# Patient Record
Sex: Female | Born: 1940 | Race: White | Hispanic: No | Smoking: Never smoker
Health system: Southern US, Community
[De-identification: ages and names within clinical notes are randomized; demographics above are authoritative.]

## PROBLEM LIST (undated history)

## (undated) DIAGNOSIS — Z8709 Personal history of other diseases of the respiratory system: Secondary | ICD-10-CM

## (undated) DIAGNOSIS — I10 Essential (primary) hypertension: Secondary | ICD-10-CM

## (undated) DIAGNOSIS — Z9289 Personal history of other medical treatment: Secondary | ICD-10-CM

## (undated) DIAGNOSIS — E785 Hyperlipidemia, unspecified: Secondary | ICD-10-CM

## (undated) DIAGNOSIS — M1990 Unspecified osteoarthritis, unspecified site: Secondary | ICD-10-CM

## (undated) DIAGNOSIS — Z9889 Other specified postprocedural states: Secondary | ICD-10-CM

## (undated) DIAGNOSIS — K219 Gastro-esophageal reflux disease without esophagitis: Secondary | ICD-10-CM

## (undated) DIAGNOSIS — C50919 Malignant neoplasm of unspecified site of unspecified female breast: Secondary | ICD-10-CM

## (undated) DIAGNOSIS — Z973 Presence of spectacles and contact lenses: Secondary | ICD-10-CM

## (undated) DIAGNOSIS — R32 Unspecified urinary incontinence: Secondary | ICD-10-CM

## (undated) DIAGNOSIS — K469 Unspecified abdominal hernia without obstruction or gangrene: Secondary | ICD-10-CM

## (undated) DIAGNOSIS — Z972 Presence of dental prosthetic device (complete) (partial): Secondary | ICD-10-CM

## (undated) DIAGNOSIS — K08109 Complete loss of teeth, unspecified cause, unspecified class: Secondary | ICD-10-CM

## (undated) DIAGNOSIS — Z8719 Personal history of other diseases of the digestive system: Secondary | ICD-10-CM

## (undated) HISTORY — DX: Unspecified abdominal hernia without obstruction or gangrene: K46.9

## (undated) HISTORY — PX: ABDOMINAL HYSTERECTOMY: SHX81

## (undated) HISTORY — PX: OTHER SURGICAL HISTORY: SHX169

## (undated) HISTORY — DX: Essential (primary) hypertension: I10

## (undated) HISTORY — DX: Gastro-esophageal reflux disease without esophagitis: K21.9

## (undated) HISTORY — PX: COLONOSCOPY: SHX174

## (undated) HISTORY — DX: Malignant neoplasm of unspecified site of unspecified female breast: C50.919

## (undated) HISTORY — DX: Hyperlipidemia, unspecified: E78.5

## (undated) HISTORY — PX: ESOPHAGOGASTRODUODENOSCOPY: SHX1529

## (undated) HISTORY — PX: CHOLECYSTECTOMY: SHX55

## (undated) HISTORY — PX: HAND SURGERY: SHX662

## (undated) HISTORY — DX: Unspecified osteoarthritis, unspecified site: M19.90

## (undated) HISTORY — DX: Personal history of other medical treatment: Z92.89

## (undated) HISTORY — DX: Other specified postprocedural states: Z98.890

## (undated) HISTORY — PX: SHOULDER SURGERY: SHX246

## (undated) HISTORY — DX: Presence of spectacles and contact lenses: Z97.3

---

## 1999-09-21 ENCOUNTER — Encounter: Admission: RE | Admit: 1999-09-21 | Discharge: 1999-09-21 | Payer: Self-pay | Admitting: Hematology and Oncology

## 1999-10-04 ENCOUNTER — Ambulatory Visit (HOSPITAL_COMMUNITY): Admission: RE | Admit: 1999-10-04 | Discharge: 1999-10-04 | Payer: Self-pay | Admitting: *Deleted

## 1999-10-18 ENCOUNTER — Encounter: Admission: RE | Admit: 1999-10-18 | Discharge: 1999-10-18 | Payer: Self-pay | Admitting: Internal Medicine

## 1999-12-07 ENCOUNTER — Encounter: Admission: RE | Admit: 1999-12-07 | Discharge: 1999-12-07 | Payer: Self-pay | Admitting: Internal Medicine

## 1999-12-07 ENCOUNTER — Ambulatory Visit (HOSPITAL_COMMUNITY): Admission: RE | Admit: 1999-12-07 | Discharge: 1999-12-07 | Payer: Self-pay | Admitting: Internal Medicine

## 1999-12-07 ENCOUNTER — Encounter: Payer: Self-pay | Admitting: Internal Medicine

## 1999-12-14 ENCOUNTER — Encounter: Admission: RE | Admit: 1999-12-14 | Discharge: 1999-12-14 | Payer: Self-pay | Admitting: Hematology and Oncology

## 1999-12-17 ENCOUNTER — Encounter: Admission: RE | Admit: 1999-12-17 | Discharge: 1999-12-17 | Payer: Self-pay | Admitting: Internal Medicine

## 1999-12-17 ENCOUNTER — Ambulatory Visit (HOSPITAL_COMMUNITY): Admission: RE | Admit: 1999-12-17 | Discharge: 1999-12-17 | Payer: Self-pay | Admitting: *Deleted

## 1999-12-21 ENCOUNTER — Other Ambulatory Visit: Admission: RE | Admit: 1999-12-21 | Discharge: 1999-12-21 | Payer: Self-pay | Admitting: Obstetrics & Gynecology

## 1999-12-21 ENCOUNTER — Encounter: Admission: RE | Admit: 1999-12-21 | Discharge: 1999-12-21 | Payer: Self-pay | Admitting: Obstetrics & Gynecology

## 1999-12-28 ENCOUNTER — Encounter: Admission: RE | Admit: 1999-12-28 | Discharge: 1999-12-28 | Payer: Self-pay | Admitting: Obstetrics & Gynecology

## 1999-12-30 ENCOUNTER — Ambulatory Visit (HOSPITAL_COMMUNITY): Admission: RE | Admit: 1999-12-30 | Discharge: 1999-12-30 | Payer: Self-pay | Admitting: Obstetrics & Gynecology

## 1999-12-30 ENCOUNTER — Encounter: Payer: Self-pay | Admitting: Obstetrics & Gynecology

## 2000-01-19 ENCOUNTER — Ambulatory Visit: Admission: RE | Admit: 2000-01-19 | Discharge: 2000-01-19 | Payer: Self-pay | Admitting: Gynecology

## 2000-01-25 ENCOUNTER — Encounter (INDEPENDENT_AMBULATORY_CARE_PROVIDER_SITE_OTHER): Payer: Self-pay

## 2000-01-25 ENCOUNTER — Inpatient Hospital Stay (HOSPITAL_COMMUNITY): Admission: RE | Admit: 2000-01-25 | Discharge: 2000-01-27 | Payer: Self-pay | Admitting: Gynecology

## 2000-02-01 ENCOUNTER — Ambulatory Visit: Admission: RE | Admit: 2000-02-01 | Discharge: 2000-02-01 | Payer: Self-pay | Admitting: Gynecology

## 2000-02-08 ENCOUNTER — Encounter: Admission: RE | Admit: 2000-02-08 | Discharge: 2000-02-08 | Payer: Self-pay | Admitting: Internal Medicine

## 2000-02-29 ENCOUNTER — Ambulatory Visit (HOSPITAL_COMMUNITY): Admission: RE | Admit: 2000-02-29 | Discharge: 2000-02-29 | Payer: Self-pay | Admitting: Gastroenterology

## 2000-03-07 ENCOUNTER — Encounter: Admission: RE | Admit: 2000-03-07 | Discharge: 2000-03-07 | Payer: Self-pay | Admitting: Obstetrics & Gynecology

## 2000-06-27 ENCOUNTER — Encounter: Admission: RE | Admit: 2000-06-27 | Discharge: 2000-06-27 | Payer: Self-pay | Admitting: Obstetrics

## 2000-08-08 ENCOUNTER — Encounter: Admission: RE | Admit: 2000-08-08 | Discharge: 2000-08-08 | Payer: Self-pay | Admitting: Obstetrics & Gynecology

## 2000-08-08 ENCOUNTER — Other Ambulatory Visit: Admission: RE | Admit: 2000-08-08 | Discharge: 2000-08-08 | Payer: Self-pay | Admitting: Obstetrics & Gynecology

## 2000-09-18 ENCOUNTER — Encounter: Admission: RE | Admit: 2000-09-18 | Discharge: 2000-09-18 | Payer: Self-pay | Admitting: Internal Medicine

## 2000-09-20 ENCOUNTER — Encounter: Admission: RE | Admit: 2000-09-20 | Discharge: 2000-09-20 | Payer: Self-pay | Admitting: Internal Medicine

## 2000-10-06 ENCOUNTER — Ambulatory Visit (HOSPITAL_COMMUNITY): Admission: RE | Admit: 2000-10-06 | Discharge: 2000-10-06 | Payer: Self-pay | Admitting: Internal Medicine

## 2000-10-06 ENCOUNTER — Encounter: Payer: Self-pay | Admitting: Internal Medicine

## 2000-10-10 ENCOUNTER — Encounter: Admission: RE | Admit: 2000-10-10 | Discharge: 2000-10-10 | Payer: Self-pay | Admitting: Obstetrics & Gynecology

## 2000-10-23 ENCOUNTER — Encounter: Admission: RE | Admit: 2000-10-23 | Discharge: 2000-10-23 | Payer: Self-pay | Admitting: Internal Medicine

## 2001-01-08 ENCOUNTER — Encounter: Admission: RE | Admit: 2001-01-08 | Discharge: 2001-01-08 | Payer: Self-pay | Admitting: Internal Medicine

## 2001-01-15 ENCOUNTER — Encounter: Admission: RE | Admit: 2001-01-15 | Discharge: 2001-01-15 | Payer: Self-pay | Admitting: Internal Medicine

## 2001-04-05 ENCOUNTER — Encounter: Admission: RE | Admit: 2001-04-05 | Discharge: 2001-04-05 | Payer: Self-pay

## 2001-04-06 ENCOUNTER — Encounter: Admission: RE | Admit: 2001-04-06 | Discharge: 2001-04-06 | Payer: Self-pay

## 2001-04-27 ENCOUNTER — Encounter: Admission: RE | Admit: 2001-04-27 | Discharge: 2001-04-27 | Payer: Self-pay | Admitting: Obstetrics & Gynecology

## 2001-05-14 ENCOUNTER — Encounter: Admission: RE | Admit: 2001-05-14 | Discharge: 2001-05-14 | Payer: Self-pay | Admitting: Internal Medicine

## 2001-06-15 ENCOUNTER — Encounter: Admission: RE | Admit: 2001-06-15 | Discharge: 2001-06-15 | Payer: Self-pay | Admitting: Internal Medicine

## 2001-09-04 ENCOUNTER — Encounter: Admission: RE | Admit: 2001-09-04 | Discharge: 2001-09-04 | Payer: Self-pay | Admitting: Internal Medicine

## 2001-10-08 ENCOUNTER — Ambulatory Visit (HOSPITAL_COMMUNITY): Admission: RE | Admit: 2001-10-08 | Discharge: 2001-10-08 | Payer: Self-pay | Admitting: *Deleted

## 2001-10-12 ENCOUNTER — Encounter: Admission: RE | Admit: 2001-10-12 | Discharge: 2001-10-12 | Payer: Self-pay | Admitting: Internal Medicine

## 2001-11-02 ENCOUNTER — Encounter: Admission: RE | Admit: 2001-11-02 | Discharge: 2001-11-02 | Payer: Self-pay | Admitting: Obstetrics & Gynecology

## 2001-12-07 ENCOUNTER — Encounter: Admission: RE | Admit: 2001-12-07 | Discharge: 2001-12-07 | Payer: Self-pay | Admitting: Obstetrics & Gynecology

## 2002-09-25 ENCOUNTER — Encounter: Admission: RE | Admit: 2002-09-25 | Discharge: 2002-09-25 | Payer: Self-pay

## 2002-09-25 ENCOUNTER — Ambulatory Visit (HOSPITAL_COMMUNITY): Admission: RE | Admit: 2002-09-25 | Discharge: 2002-09-25 | Payer: Self-pay | Admitting: Internal Medicine

## 2002-09-25 ENCOUNTER — Encounter: Payer: Self-pay | Admitting: Internal Medicine

## 2002-10-09 ENCOUNTER — Ambulatory Visit (HOSPITAL_COMMUNITY): Admission: RE | Admit: 2002-10-09 | Discharge: 2002-10-09 | Payer: Self-pay | Admitting: Internal Medicine

## 2002-10-09 ENCOUNTER — Encounter: Admission: RE | Admit: 2002-10-09 | Discharge: 2002-10-09 | Payer: Self-pay | Admitting: Internal Medicine

## 2002-10-23 ENCOUNTER — Encounter: Admission: RE | Admit: 2002-10-23 | Discharge: 2002-10-23 | Payer: Self-pay | Admitting: Internal Medicine

## 2003-01-10 ENCOUNTER — Encounter: Admission: RE | Admit: 2003-01-10 | Discharge: 2003-01-10 | Payer: Self-pay | Admitting: Internal Medicine

## 2003-05-25 ENCOUNTER — Encounter: Payer: Self-pay | Admitting: General Practice

## 2003-05-25 ENCOUNTER — Ambulatory Visit (HOSPITAL_COMMUNITY): Admission: RE | Admit: 2003-05-25 | Discharge: 2003-05-25 | Payer: Self-pay | Admitting: General Practice

## 2003-05-27 ENCOUNTER — Ambulatory Visit (HOSPITAL_COMMUNITY): Admission: RE | Admit: 2003-05-27 | Discharge: 2003-05-29 | Payer: Self-pay | Admitting: General Surgery

## 2003-05-27 ENCOUNTER — Encounter (INDEPENDENT_AMBULATORY_CARE_PROVIDER_SITE_OTHER): Payer: Self-pay | Admitting: *Deleted

## 2003-05-27 ENCOUNTER — Encounter: Payer: Self-pay | Admitting: General Surgery

## 2003-05-28 ENCOUNTER — Encounter: Payer: Self-pay | Admitting: Internal Medicine

## 2003-07-04 ENCOUNTER — Encounter: Admission: RE | Admit: 2003-07-04 | Discharge: 2003-07-04 | Payer: Self-pay | Admitting: Internal Medicine

## 2003-07-15 ENCOUNTER — Encounter: Admission: RE | Admit: 2003-07-15 | Discharge: 2003-07-15 | Payer: Self-pay | Admitting: Internal Medicine

## 2003-09-24 ENCOUNTER — Encounter: Admission: RE | Admit: 2003-09-24 | Discharge: 2003-09-24 | Payer: Self-pay | Admitting: Internal Medicine

## 2003-10-13 ENCOUNTER — Ambulatory Visit (HOSPITAL_COMMUNITY): Admission: RE | Admit: 2003-10-13 | Discharge: 2003-10-13 | Payer: Self-pay | Admitting: Internal Medicine

## 2003-12-20 DIAGNOSIS — Z9289 Personal history of other medical treatment: Secondary | ICD-10-CM

## 2003-12-20 HISTORY — DX: Personal history of other medical treatment: Z92.89

## 2004-02-19 ENCOUNTER — Encounter: Admission: RE | Admit: 2004-02-19 | Discharge: 2004-02-19 | Payer: Self-pay | Admitting: Internal Medicine

## 2004-03-11 ENCOUNTER — Encounter: Admission: RE | Admit: 2004-03-11 | Discharge: 2004-03-11 | Payer: Self-pay | Admitting: Internal Medicine

## 2005-03-09 ENCOUNTER — Ambulatory Visit: Payer: Self-pay | Admitting: Internal Medicine

## 2005-03-29 ENCOUNTER — Ambulatory Visit (HOSPITAL_COMMUNITY): Admission: RE | Admit: 2005-03-29 | Discharge: 2005-03-29 | Payer: Self-pay | Admitting: Internal Medicine

## 2005-04-11 ENCOUNTER — Ambulatory Visit: Payer: Self-pay | Admitting: Internal Medicine

## 2006-05-29 ENCOUNTER — Other Ambulatory Visit: Admission: RE | Admit: 2006-05-29 | Discharge: 2006-05-29 | Payer: Self-pay | Admitting: Family Medicine

## 2006-06-02 ENCOUNTER — Ambulatory Visit (HOSPITAL_COMMUNITY): Admission: RE | Admit: 2006-06-02 | Discharge: 2006-06-02 | Payer: Self-pay | Admitting: Family Medicine

## 2006-09-06 ENCOUNTER — Inpatient Hospital Stay (HOSPITAL_COMMUNITY): Admission: RE | Admit: 2006-09-06 | Discharge: 2006-09-10 | Payer: Self-pay | Admitting: Orthopedic Surgery

## 2007-06-01 ENCOUNTER — Other Ambulatory Visit: Admission: RE | Admit: 2007-06-01 | Discharge: 2007-06-01 | Payer: Self-pay | Admitting: Family Medicine

## 2007-06-08 ENCOUNTER — Ambulatory Visit (HOSPITAL_COMMUNITY): Admission: RE | Admit: 2007-06-08 | Discharge: 2007-06-08 | Payer: Self-pay | Admitting: Family Medicine

## 2008-06-06 ENCOUNTER — Other Ambulatory Visit: Admission: RE | Admit: 2008-06-06 | Discharge: 2008-06-06 | Payer: Self-pay | Admitting: Family Medicine

## 2008-06-13 ENCOUNTER — Ambulatory Visit (HOSPITAL_COMMUNITY): Admission: RE | Admit: 2008-06-13 | Discharge: 2008-06-13 | Payer: Self-pay | Admitting: Family Medicine

## 2009-06-19 ENCOUNTER — Ambulatory Visit (HOSPITAL_COMMUNITY): Admission: RE | Admit: 2009-06-19 | Discharge: 2009-06-19 | Payer: Self-pay | Admitting: Family Medicine

## 2009-10-21 ENCOUNTER — Other Ambulatory Visit: Admission: RE | Admit: 2009-10-21 | Discharge: 2009-10-21 | Payer: Self-pay | Admitting: Family Medicine

## 2010-06-18 DIAGNOSIS — Z9889 Other specified postprocedural states: Secondary | ICD-10-CM

## 2010-06-18 HISTORY — DX: Other specified postprocedural states: Z98.890

## 2010-06-22 ENCOUNTER — Ambulatory Visit (HOSPITAL_COMMUNITY): Admission: RE | Admit: 2010-06-22 | Discharge: 2010-06-22 | Payer: Self-pay | Admitting: Family Medicine

## 2010-11-03 ENCOUNTER — Other Ambulatory Visit: Admission: RE | Admit: 2010-11-03 | Discharge: 2010-11-03 | Payer: Self-pay | Admitting: Family Medicine

## 2011-05-06 NOTE — Op Note (Signed)
NAME:  Elizabeth, Lloyd                        ACCOUNT NO.:  [id number]   MEDICAL RECORD NO.:  [id number]                   PATIENT TYPE:  OIB   LOCATION:  2550                                 FACILITY:  [facility]   PHYSICIAN:  Sharlet Salina T. Hoxworth, M.D.          DATE OF BIRTH:  [birth year]   DATE OF PROCEDURE:  05/27/2003  DATE OF DISCHARGE:                                 OPERATIVE REPORT   PREOPERATIVE DIAGNOSIS:  Acute cholecystitis and cholelithiasis.   POSTOPERATIVE DIAGNOSIS:  Acute cholecystitis and cholelithiasis,  choledocholithiasis.   OPERATION PERFORMED:  Laparoscopic cholecystectomy with intraoperative  cholangiogram.   SURGEON:  Sharlet Salina T. Hoxworth, M.D.   ASSISTANT:  Donnie Coffin. Samuella Cota, M.D.   ANESTHESIA:  General.   INDICATIONS FOR PROCEDURE:  The patient is a 70 year old white female in her  usual state of good health until about 48 hours ago when she developed the  sudden onset of sharp, right upper quadrant and flank pain and nausea and  vomiting.  She has had a work-up yesterday including gallbladder ultrasound  showing multiple gallstone and a thickened gallbladder wall.  She has had  marked tenderness in the right upper quadrant.  Liver function tests are  moderately elevated with a bilirubin of 2 and alkaline phosphatase of 178.  Urgent laparoscopic cholecystectomy with cholangiogram has been recommended  and accepted.  The nature of the procedure, its indications and risks of  bleeding, infection, bile leak and bile duct injury were discussed and  understood.  She is now brought to the operating room for this procedure.   DESCRIPTION OF PROCEDURE:  The patient was brought to the operating room and  placed in supine position on the operating table and general endotracheal  anesthesia was induced.  She had received preoperative broad spectrum  antibiotics.  The abdomen was sterilely prepped and draped.  Local  anesthesia was used to infiltrate the trocar  sites prior to the incisions.  A 1 cm incision was made at the umbilicus and dissection carried down to the  midline fascia which was sharply incised for 1 cm with the peritoneum  entered under direct vision.  Through a mattress suture of 0 Vicryl, the  Hasson trocar was placed and pneumoperitoneum established.  Under direct  vision, a 10 mm trocar was placed in the subxiphoid area and two 5 mm  trocars on the right subcostal margin.  The gallbladder was severely acutely  inflamed with early gangrenous changes.  Omental adhesions were carefully  taken down bluntly and the gallbladder was decompressed with a needle  aspirator.  The fundus was grasped and elevated up over the liver and the  infundibulum retracted inferolaterally.  Fibrofatty tissue was stripped down  off the neck of the gallbladder toward the porta hepatis.  Calot's triangle  was dissected, mostly with blunt dissection with very edematous tissue.  The  cystic artery was identified coursing up on the gallbladder wall and was  divided between two proximal and distal clips.  The distal gallbladder and  gallbladder cystic duct junction was dissected  and the cystic duct  identified, dissected at its junction with the gallbladder 360 degrees.  The  cystic duct was then clipped at the gallbladder junction and operative  cholangiogram obtained through the cystic duct.  This showed good filling of  a nondilated common bile duct, common hepatic duct and intrahepatic ducts.  There was, however, a meniscus filling defect at the distal common bile duct  with minimal if any contrast getting through to the duodenum.  Following  this, the cholangiocath was removed.  I did pass a Fogarty catheter through  the cystic duct.  I placed one into the common bile duct but was unable to  get past the ampulla a couple of attempts and could not retrieve the stone.  Due to the severe inflammation and somewhat friable nature of the cystic  duct, I  elected not to attempt a laparoscopic common duct exploration but to  complete the cholecystectomy and plan postoperative endoscopic retrograde  cholangiopancreatography.  At this point the cystic duct was triply clipped  proximally and divided.  The gallbladder was then dissected free from its  bed using hook cautery.  There was marked edema and inflammation.  The  gallbladder was placed in the EndoCatch bag and removed through the  umbilicus.  The gallbladder bed was cauterized and the right upper quadrant  thoroughly irrigated and hemostasis assured.  Closed suction drain was left  using Morrison's pouch and brought out through one of the lateral trocar  sites.  The trocars were removed under direct vision and all CO2 evacuated  from the peritoneal cavity.  The mattress suture was secured to the  umbilicus.  Skin incisions were closed with interrupted subcuticular 4-0  Monocryl and Steri-Strips.  Sponge, needle and instrument counts were  correct.  Dry sterile dressings were applied.  The patient was taken to the  recovery room in good condition.                                                Lorne Skeens. Hoxworth, M.D.    Tory Emerald  D:  05/27/2003  T:  05/27/2003  Job:  [id number]

## 2011-05-06 NOTE — Op Note (Signed)
NAME:  Elizabeth Lloyd, Elizabeth Lloyd NO.:  [id number]   MEDICAL RECORD NO.:  [id number]                   PATIENT TYPE:  OIB   LOCATION:  5730                                 FACILITY:  [facility]   PHYSICIAN:  Wilhemina Bonito. Marina Goodell, M.D. [facility]             DATE OF BIRTH:  [birth year]   DATE OF PROCEDURE:  05/28/2003  DATE OF DISCHARGE:                                 OPERATIVE REPORT   PROCEDURE:  Endoscopic retrograde cholangiopancreatography with biliary  sphincterotomy.   INDICATIONS FOR PROCEDURE:  Apparent retained common bile duct stone post  laparoscopic cholecystectomy.   HISTORY:  This is a pleasant 70 year old female who was admitted to the  hospital May 27, 2003 with acute cholecystitis. She underwent laparoscopic  cholecystectomy with Dr. Johna Sheriff. She is indeed found to have acute  cholecystitis with cholelithiasis. Intraoperative cholangiogram revealed an  apparent distal filling defect with obstruction worrisome for stone. A drain  was left in the gallbladder bed. The patient has recovered well since  surgery. She does have mild abnormalities in the liver function tests. She  is for ERCP with possible sphincterotomy and stone extraction. The nature of  the procedure as well as its risks, benefits, and alternatives were  discussed in detail. She understood and agreed to proceed.   PHYSICAL EXAMINATION:  GENERAL:  Well appearing female in no acute distress.  She is alert and oriented.  VITAL SIGNS:  Stable.  LUNGS:  Clear.  HEART:  Regular.  ABDOMEN:  Soft with slight tenderness near the surgical incision sites.   DESCRIPTION OF PROCEDURE:  After informed consent was obtained, the patient  was sedated with 80 mg of Demerol and 8 mg of Versed IV.  Preoperative  antibiotics were continued. Glucagon 1.0 mg IV was given as a duodenal  relaxant. The Olympus side viewing endoscope was passed blindly into the  esophagus. The stomach revealed several traumatic areas on  the mucosa  consistent with prior NG tube placement. Otherwise normal. The duodenal bulb  was normal. The post bulbar duodenum was normal. The major ampulla was  normal. The minor ampulla was not sought.   X-RAY FINDINGS:  1. Scout radiograph of the abdomen with the endoscope in position revealed     cholecystectomy clips as well as gallbladder bed drain. No other     abnormalities.  2. Initial injection of contrast via the major papilla yielded a normal     pancreatogram. Access to the common bile duct was subsequently obtained     using a hydrophilic guidewire. Complete filling of the biliary tree     revealed no abnormalities of the common bile duct, common hepatic duct,     right or left hepatic ducts or intrahepatic ducts. The cystic duct     remnant was normal. No obvious stones. Phase was slightly delayed.   THERAPY:  Biliary sphincterotomy was made with hydrophilic guidewire in the  proximal biliary tree.  Cutting was via the ERBE system with cutting in the  12 o'clock orientation. The sphincterotomy size was deemed moderate. After  completing sphincterotomy, the cutting catheter was exchanged for an 8 mm  balloon. This was pulled through the biliary system several times with no  extraction of stones or debris. Occlusion cholangiogram was then performed.  System drained quite readily.   IMPRESSION:  Normal cholangiogram post laparoscopic cholecystectomy with no  evidence of stone. Status post biliary sphincterotomy with negative balloon  pull-throughs.   RECOMMENDATIONS:  1. Continue antibiotics.  2. Postoperative care per Dr. Johna Sheriff.                                               Wilhemina Bonito. Marina Goodell, M.D. [facility]    JNP/MEDQ  D:  05/28/2003  T:  05/29/2003  Job:  [id number]   cc:   Lorne Skeens. Hoxworth, M.D.  [address]  [place]  [place] [address]  Fax: [phone]   Anselmo Rod, M.D.  [address]  [address]  [place]  [place] [address]  Fax:  [phone]

## 2011-05-06 NOTE — Discharge Summary (Signed)
NAME:  Elizabeth Lloyd, FIFER NO.:  [id number]   MEDICAL RECORD NO.:  [id number]          PATIENT TYPE:  INP   LOCATION:  5008                         FACILITY:  [facility]   PHYSICIAN:  Nadara Mustard, MD     DATE OF BIRTH:  [birth year]   DATE OF ADMISSION:  09/06/2006  DATE OF DISCHARGE:  09/10/2006                                 DISCHARGE SUMMARY   DIAGNOSIS:  Tricompartmental osteoarthritis, left knee.   PROCEDURE:  Left total knee arthroplasty.   Discharged to home in stable condition with home health physical therapy.   PLAN:  To follow-up in the office in 2 weeks.   HISTORY OF PRESENT ILLNESS:  The patient is a 70 year old woman with  tricompartmental osteoarthritis of her left knee.  She has failed  conservative care including arthroscopy, anti-inflammatories, and steroid  injections without relief and presents at this time for total knee  arthroplasty.   The patient's hospital course was essentially unremarkable.  She underwent a  left total knee arthroplasty on September 06, 2006, with DePuy components.  She received Kefzol for infection prophylaxis.  Tourniquet time was 33  minutes at 300-mmHg.  She was started on Coumadin for DVT prophylaxis and  continued with 24 hours of antibiotics for IV infection prophylaxis.  Radiographs showed a stable alignment.  She was started on physical therapy  with progressive ambulation.  She progressed well with therapy and was  discharged to home in stable condition on September 10, 2006, with [facility] with followup in the office in 2 weeks.      Nadara Mustard, MD  Electronically Signed     MVD/MEDQ  D:  09/26/2006  T:  09/27/2006  Job:  [id number]

## 2011-05-06 NOTE — Op Note (Signed)
NAME:  Elizabeth Lloyd, Elizabeth Lloyd NO.:  [id number]   MEDICAL RECORD NO.:  [id number]          PATIENT TYPE:  INP   LOCATION:  5008                         FACILITY:  [facility]   PHYSICIAN:  Nadara Mustard, MD     DATE OF BIRTH:  [birth year]   DATE OF PROCEDURE:  09/06/2006  DATE OF DISCHARGE:                                 OPERATIVE REPORT   PREOPERATIVE DIAGNOSIS:  Osteoarthritis, left knee.   POSTOPERATIVE DIAGNOSIS:  Osteoarthritis, left knee.   PROCEDURE:  Left total knee arthroplasty with DePuy mobile bearing  components #3 tibia, #3 femur, 10-mm posterior stabilized poly tray with a  35-mm patella.   SURGEON:  Nadara Mustard, MD   ANESTHESIA:  General.   ESTIMATED BLOOD LOSS:  Minimal.   ANTIBIOTICS:  1 gram of Kefzol.   DRAINS:  None.   COMPLICATIONS:  None.   TOURNIQUET TIME:  33 minutes at 300 mmHg the thigh.   DISPOSITION:  To PACU in stable condition.   INDICATIONS FOR PROCEDURE:  The patient is a 70 year old woman with  osteoarthritis of her left knee.  She has failed conservative care and  wishes to proceed with total knee arthroplasty at this time.  Risks and  benefits were discussed including infection, neurovascular injury,  persistent pain, DVT, pulmonary embolus, need for additional surgery.  The  patient states she understands and wished proceed at this time.   DESCRIPTION OF PROCEDURE:  The patient was brought to OR room 4 and  underwent a general anesthetic.  After adequate level of anesthesia obtained  the patient's left lower extremity was prepped using DuraPrep, draped in a  sterile field.  Collier Flowers was used to cover all exposed skin.  The knee was  flexed and the midline incision was made.  This was carried down with a  medial parapatellar retinacular incision.  The drill was used for the  femoral canal and the IM guide was used for the femur set for 5 degrees  valgus at 10 mm the distal 10 mm cut was made.  This was sized for a size 3  and  the chamfer cuts were made for size 3.  Attention was then focused on  the tibia.  External alignment guide was used and this was used to take 8 mm  off the most prominent lateral tibial plateau.  This was set for neutral  varus and valgus, neutral posterior slope.  The tibial cut was made.  This  was sized for a size 3 and the tibial trial and key punch was placed for  size 3.  Attention was then focused on the femur.  The box cut was made for  the femur.  The femoral and tibial trials were placed and this had the best  range of motion with the size 10 tray, with stable varus and valgus stress.  The resurfacing cut was made for the patella and 10 mm was taken off the  patella.  The keel punches were made for the patella and the punches were  made for the femur as well.  The instruments were removed.  The  knee was  irrigated with pulse lavage.  The posterior capsule and medial and anterior  aspect of the knee were injected with a total of 60 mL of quarter percent  Marcaine plain.  The tibial femoral and patellar components were cemented in  place with the patella component placed.  The knee was irrigated with pulse  lavage prior to and after the components were placed.  The knee was left in  extension until the cement had hardened.  The knee was then placed through  full range of motion and the patella tracked midline.  The tourniquet was  deflated after 33 minutes.  The tourniquet was inflated prior to the bone  cuts.  The retinacular incision was closed using #1 Vicryl.  Subcu was  closed using 2-0 Vicryl, the skin was closed using Proximate staples.  The  wound was covered with Adaptic orthopedic sponges, Webril and a Coban  dressing.  The patient was placed in a ice pack, extubated and taken to PACU  in stable condition.      Nadara Mustard, MD  Electronically Signed     MVD/MEDQ  D:  09/06/2006  T:  09/07/2006  Job:  [id number]

## 2011-05-06 NOTE — Procedures (Signed)
[place]  Patient:    Elizabeth Lloyd, Elizabeth Lloyd                MRN: [id number] Proc. Date: 02/29/00 Adm. Date:  [id number] Attending:  Charna Elizabeth CC:         Myles Rosenthal, M.D.                           Procedure Report  DATE OF BIRTH:  [birth year]  REFERRING PHYSICIAN:  Myles Rosenthal, M.D.  PROCEDURE PERFORMED:  Esophagogastroduodenoscopy with biopsies.  ENDOSCOPIST:  Anselmo Rod, M.D.  INSTRUMENT USED:  Olympus video panendoscope.  INDICATIONS FOR PROCEDURE:  The patient is a 70 year old white female with epigastric pain, blood in stools, rule out peptic ulcer disease, esophagitis, gastritis, etc.  PREPROCEDURE PREPARATION:  Informed consent was procured from the patient.  The  patient was fasted for eight hours prior to the procedure.  PREPROCEDURE PHYSICAL:  The patient had stable vital signs.  Neck supple. Chest clear to auscultation.  S1, S2 regular.  Abdomen soft with normal abdominal bowel sounds.  Epigastric tenderness on palpation.  There was no rebound or rigidity,  minimal guarding present.  DESCRIPTION OF PROCEDURE:  The patient was placed in left lateral decubitus position and sedated with 40 mg of Demerol and 5 mg of Versed intravenously. Once the patient was adequately sedated and maintained on low-flow oxygen and continuous cardiac monitoring, the Olympus video panendoscope was advanced through the mouthpiece, over the tongue, into the esophagus under direct vision.  The proximal esophagus appeared normal but there was evidence of erosive esophagitis in the distal esophagus around the GE junction.  The scope was then advanced into the stomach and a small hiatal hernia was seen on high retroflexion.  There was antral gastritis with no frank ulcers seen.  There was mild duodenitis in the duodenal  bulb.  The patient tolerated the procedure well without complications.  IMPRESSION: 1. Distal erosive  esophagitis around the gastroesophageal junction. 2. Small hiatal hernia seen on retroflexion in the high cardia. 3. Antral gastritis with no ulcer seen. 4. Mild duodenitis in the duodenal bulb. 5. Antral biopsies done for Helicobacter pylori by CLO test.  RECOMMENDATION: 1. Patient has been prescribed Protonix 40 mg 1 p.o. q.d. #30 pills with    three refills has been called in to her local pharmacy. 2. She has been advised to try Carafate slurry 1 gm q.i.d. for the next 15    days.  This has also been called in to her local pharmacy. 3. She has strongly been advised to refrain from the use of all nonsteroidals. 4. Proceed with colonoscopy at this time. 5. Follow up in the office in the next 7 to 10 days. DD:  02/29/00 TD:  03/01/00 Job: [id number]

## 2011-05-06 NOTE — Procedures (Signed)
[place]  Patient:    Elizabeth Lloyd, Elizabeth Lloyd                MRN: [id number] Proc. Date: 02/29/00 Adm. Date:  [id number] Attending:  Charna Elizabeth CC:         Myles Rosenthal, M.D.                           Procedure Report  DATE OF BIRTH:  November 30, 1941  REFERRING PHYSICIAN:  Myles Rosenthal, M.D.  PROCEDURE PERFORMED:  Colonoscopy.  ENDOSCOPIST:  Anselmo Rod, M.D.  INSTRUMENT USED:  Olympus video colonoscope.  INDICATIONS FOR PROCEDURE:  Rectal bleeding and family history of colon cancer n 70 year old white female, rule out masses, polyps, erosions, ulcerations, etc.  PREPROCEDURE PREPARATION:  Informed consent was procured from the patient.  The  patient was fasted for eight hours prior to the procedure, and prepped with a bottle of magnesium citrate and a gallon of NuLytely the night prior to the procedure.  PREPROCEDURE PHYSICAL:  VITAL SIGNS:  Stable.  NECK:  Supple.  CHEST:  Clear to auscultation, S1 and S2 regular.  ABDOMEN:  Soft with normal abdominal bowel sounds.  Epigastric tenderness on palpation with guarding noted _______.  DESCRIPTION OF PROCEDURE:  The patient was placed in the left lateral decubitus  position.  No additional sedation was used for the colonoscopy.  Once the patient was adequately positioned, the Olympus video colonoscope was advanced from the rectum to the cecum without difficulty.  Except for small internal and external  hemorrhoids, no other abnormalities were seen.  No masses, polyps, erosions, ulcerations, or diverticular disease was present.  The patient tolerated the procedure well without complication.  IMPRESSION:  Normal colonoscopy except for small nonbleeding internal and external hemorrhoids.  RECOMMENDATIONS: 1. The patient has been advised to increase fluid and fiber in her diet. 2. Repeat colorectal cancer screening is recommended in the next five years    considering her  family history of colon cancer. 3. Outpatient follow up is advised in the next 7-10 days. DD:  02/29/00 TD:  03/01/00 Job: [id number]

## 2011-06-14 ENCOUNTER — Other Ambulatory Visit (HOSPITAL_COMMUNITY): Payer: Self-pay | Admitting: Family Medicine

## 2011-06-14 DIAGNOSIS — Z1231 Encounter for screening mammogram for malignant neoplasm of breast: Secondary | ICD-10-CM

## 2011-06-24 ENCOUNTER — Ambulatory Visit (HOSPITAL_COMMUNITY)
Admission: RE | Admit: 2011-06-24 | Discharge: 2011-06-24 | Disposition: A | Discharge status: Home / Self Care | Payer: Medicare Other | Source: Ambulatory Visit | Attending: Family Medicine | Admitting: Family Medicine

## 2011-06-24 DIAGNOSIS — Z1231 Encounter for screening mammogram for malignant neoplasm of breast: Secondary | ICD-10-CM | POA: Insufficient documentation

## 2011-06-28 ENCOUNTER — Other Ambulatory Visit: Payer: Self-pay | Admitting: Family Medicine

## 2011-06-28 DIAGNOSIS — R928 Other abnormal and inconclusive findings on diagnostic imaging of breast: Secondary | ICD-10-CM

## 2011-07-01 ENCOUNTER — Ambulatory Visit
Admission: RE | Admit: 2011-07-01 | Discharge: 2011-07-01 | Disposition: A | Discharge status: Home / Self Care | Payer: Medicare Other | Source: Ambulatory Visit | Attending: Family Medicine | Admitting: Family Medicine

## 2011-07-01 DIAGNOSIS — R928 Other abnormal and inconclusive findings on diagnostic imaging of breast: Secondary | ICD-10-CM

## 2011-12-06 ENCOUNTER — Other Ambulatory Visit: Payer: Self-pay | Admitting: Family Medicine

## 2011-12-06 DIAGNOSIS — N63 Unspecified lump in unspecified breast: Secondary | ICD-10-CM

## 2011-12-20 HISTORY — PX: BREAST LUMPECTOMY: SHX2

## 2012-01-20 ENCOUNTER — Other Ambulatory Visit: Payer: Self-pay | Admitting: Family Medicine

## 2012-01-20 ENCOUNTER — Ambulatory Visit
Admission: RE | Admit: 2012-01-20 | Discharge: 2012-01-20 | Disposition: A | Discharge status: Home / Self Care | Payer: Medicare Other | Source: Ambulatory Visit | Attending: Family Medicine | Admitting: Family Medicine

## 2012-01-20 DIAGNOSIS — N63 Unspecified lump in unspecified breast: Secondary | ICD-10-CM

## 2012-03-27 ENCOUNTER — Ambulatory Visit: Payer: Medicare Other | Admitting: Physical Therapy

## 2012-03-27 ENCOUNTER — Ambulatory Visit: Payer: Medicare Other | Attending: Orthopedic Surgery | Admitting: Physical Therapy

## 2012-03-27 DIAGNOSIS — M25619 Stiffness of unspecified shoulder, not elsewhere classified: Secondary | ICD-10-CM | POA: Insufficient documentation

## 2012-03-27 DIAGNOSIS — IMO0001 Reserved for inherently not codable concepts without codable children: Secondary | ICD-10-CM | POA: Insufficient documentation

## 2012-03-27 DIAGNOSIS — M25519 Pain in unspecified shoulder: Secondary | ICD-10-CM | POA: Insufficient documentation

## 2012-03-27 DIAGNOSIS — M6281 Muscle weakness (generalized): Secondary | ICD-10-CM | POA: Insufficient documentation

## 2012-03-29 ENCOUNTER — Ambulatory Visit: Payer: Medicare Other | Admitting: Physical Therapy

## 2012-04-03 ENCOUNTER — Ambulatory Visit: Payer: Medicare Other | Admitting: Physical Therapy

## 2012-04-05 ENCOUNTER — Ambulatory Visit: Payer: Medicare Other | Admitting: Physical Therapy

## 2012-04-10 ENCOUNTER — Ambulatory Visit: Payer: Medicare Other | Admitting: Physical Therapy

## 2012-04-12 ENCOUNTER — Ambulatory Visit: Payer: Medicare Other | Admitting: Physical Therapy

## 2012-04-17 ENCOUNTER — Ambulatory Visit: Payer: Medicare Other | Admitting: Physical Therapy

## 2012-04-19 ENCOUNTER — Ambulatory Visit: Payer: Medicare Other | Attending: Orthopedic Surgery | Admitting: Physical Therapy

## 2012-04-19 DIAGNOSIS — M25619 Stiffness of unspecified shoulder, not elsewhere classified: Secondary | ICD-10-CM | POA: Insufficient documentation

## 2012-04-19 DIAGNOSIS — IMO0001 Reserved for inherently not codable concepts without codable children: Secondary | ICD-10-CM | POA: Insufficient documentation

## 2012-04-19 DIAGNOSIS — M25519 Pain in unspecified shoulder: Secondary | ICD-10-CM | POA: Insufficient documentation

## 2012-04-19 DIAGNOSIS — M6281 Muscle weakness (generalized): Secondary | ICD-10-CM | POA: Insufficient documentation

## 2012-04-24 ENCOUNTER — Ambulatory Visit: Payer: Medicare Other | Admitting: Physical Therapy

## 2012-04-26 ENCOUNTER — Ambulatory Visit: Payer: Medicare Other | Admitting: Physical Therapy

## 2012-05-01 ENCOUNTER — Other Ambulatory Visit: Payer: Self-pay | Admitting: Family Medicine

## 2012-05-01 ENCOUNTER — Ambulatory Visit: Payer: Medicare Other | Admitting: Physical Therapy

## 2012-05-01 DIAGNOSIS — R921 Mammographic calcification found on diagnostic imaging of breast: Secondary | ICD-10-CM

## 2012-05-03 ENCOUNTER — Ambulatory Visit: Payer: Medicare Other | Admitting: Physical Therapy

## 2012-05-08 ENCOUNTER — Ambulatory Visit: Payer: Medicare Other | Admitting: Physical Therapy

## 2012-05-10 ENCOUNTER — Ambulatory Visit: Payer: Medicare Other | Admitting: Physical Therapy

## 2012-05-15 ENCOUNTER — Ambulatory Visit: Payer: Medicare Other | Admitting: Physical Therapy

## 2012-05-17 ENCOUNTER — Ambulatory Visit: Payer: Medicare Other | Admitting: Physical Therapy

## 2012-05-22 ENCOUNTER — Ambulatory Visit: Payer: Medicare Other | Attending: Orthopedic Surgery | Admitting: Physical Therapy

## 2012-05-22 DIAGNOSIS — M25619 Stiffness of unspecified shoulder, not elsewhere classified: Secondary | ICD-10-CM | POA: Insufficient documentation

## 2012-05-22 DIAGNOSIS — M25519 Pain in unspecified shoulder: Secondary | ICD-10-CM | POA: Insufficient documentation

## 2012-05-22 DIAGNOSIS — IMO0001 Reserved for inherently not codable concepts without codable children: Secondary | ICD-10-CM | POA: Insufficient documentation

## 2012-05-22 DIAGNOSIS — M6281 Muscle weakness (generalized): Secondary | ICD-10-CM | POA: Insufficient documentation

## 2012-05-25 ENCOUNTER — Ambulatory Visit: Payer: Medicare Other | Admitting: Physical Therapy

## 2012-05-28 ENCOUNTER — Ambulatory Visit: Payer: Medicare Other | Admitting: Physical Therapy

## 2012-05-30 ENCOUNTER — Ambulatory Visit: Payer: Medicare Other | Admitting: Physical Therapy

## 2012-06-04 ENCOUNTER — Encounter: Payer: Medicare Other | Admitting: Physical Therapy

## 2012-06-05 ENCOUNTER — Ambulatory Visit: Payer: Medicare Other | Admitting: Physical Therapy

## 2012-06-07 ENCOUNTER — Ambulatory Visit: Payer: Medicare Other | Admitting: Physical Therapy

## 2012-06-11 ENCOUNTER — Ambulatory Visit: Payer: Medicare Other | Admitting: Physical Therapy

## 2012-06-14 ENCOUNTER — Ambulatory Visit: Payer: Medicare Other | Admitting: Physical Therapy

## 2012-06-27 ENCOUNTER — Other Ambulatory Visit: Payer: Self-pay | Admitting: Family Medicine

## 2012-06-27 ENCOUNTER — Ambulatory Visit
Admission: RE | Admit: 2012-06-27 | Discharge: 2012-06-27 | Disposition: A | Discharge status: Home / Self Care | Payer: Medicare Other | Source: Ambulatory Visit | Attending: Family Medicine | Admitting: Family Medicine

## 2012-06-27 DIAGNOSIS — R921 Mammographic calcification found on diagnostic imaging of breast: Secondary | ICD-10-CM

## 2012-06-29 ENCOUNTER — Other Ambulatory Visit: Payer: Self-pay | Admitting: Family Medicine

## 2012-06-29 ENCOUNTER — Ambulatory Visit
Admission: RE | Admit: 2012-06-29 | Discharge: 2012-06-29 | Disposition: A | Discharge status: Home / Self Care | Payer: Medicare Other | Source: Ambulatory Visit | Attending: Family Medicine | Admitting: Family Medicine

## 2012-06-29 DIAGNOSIS — R921 Mammographic calcification found on diagnostic imaging of breast: Secondary | ICD-10-CM

## 2012-07-02 ENCOUNTER — Other Ambulatory Visit: Payer: Self-pay | Admitting: Family Medicine

## 2012-07-02 ENCOUNTER — Ambulatory Visit
Admission: RE | Admit: 2012-07-02 | Discharge: 2012-07-02 | Disposition: A | Discharge status: Home / Self Care | Payer: Medicare Other | Source: Ambulatory Visit | Attending: Family Medicine | Admitting: Family Medicine

## 2012-07-02 DIAGNOSIS — C50919 Malignant neoplasm of unspecified site of unspecified female breast: Secondary | ICD-10-CM

## 2012-07-02 DIAGNOSIS — R921 Mammographic calcification found on diagnostic imaging of breast: Secondary | ICD-10-CM

## 2012-07-02 DIAGNOSIS — D0510 Intraductal carcinoma in situ of unspecified breast: Secondary | ICD-10-CM

## 2012-07-05 ENCOUNTER — Telehealth: Payer: Self-pay | Admitting: *Deleted

## 2012-07-05 ENCOUNTER — Other Ambulatory Visit: Payer: Self-pay | Admitting: *Deleted

## 2012-07-05 DIAGNOSIS — C50412 Malignant neoplasm of upper-outer quadrant of left female breast: Secondary | ICD-10-CM | POA: Insufficient documentation

## 2012-07-05 DIAGNOSIS — C50419 Malignant neoplasm of upper-outer quadrant of unspecified female breast: Secondary | ICD-10-CM

## 2012-07-05 NOTE — Telephone Encounter (Signed)
Confirmed [facility] for 07/11/12 at 1230 .  Instructions and contact information given.

## 2012-07-06 ENCOUNTER — Ambulatory Visit
Admission: RE | Admit: 2012-07-06 | Discharge: 2012-07-06 | Disposition: A | Discharge status: Home / Self Care | Payer: Medicare Other | Source: Ambulatory Visit | Attending: Family Medicine | Admitting: Family Medicine

## 2012-07-06 ENCOUNTER — Other Ambulatory Visit: Payer: Self-pay | Admitting: Family Medicine

## 2012-07-06 DIAGNOSIS — D0510 Intraductal carcinoma in situ of unspecified breast: Secondary | ICD-10-CM

## 2012-07-06 DIAGNOSIS — C50919 Malignant neoplasm of unspecified site of unspecified female breast: Secondary | ICD-10-CM

## 2012-07-06 MED ORDER — GADOBENATE DIMEGLUMINE 529 MG/ML IV SOLN
20.0000 mL | Freq: Once | INTRAVENOUS | Status: AC | PRN
  Administered 2012-07-06: 20 mL via INTRAVENOUS

## 2012-07-09 ENCOUNTER — Other Ambulatory Visit: Payer: Medicare Other

## 2012-07-11 ENCOUNTER — Encounter: Payer: Self-pay | Admitting: Radiation Oncology

## 2012-07-11 ENCOUNTER — Encounter (INDEPENDENT_AMBULATORY_CARE_PROVIDER_SITE_OTHER): Payer: Self-pay | Admitting: General Surgery

## 2012-07-11 ENCOUNTER — Ambulatory Visit (HOSPITAL_BASED_OUTPATIENT_CLINIC_OR_DEPARTMENT_OTHER): Payer: Medicare Other | Admitting: Oncology

## 2012-07-11 ENCOUNTER — Ambulatory Visit (HOSPITAL_BASED_OUTPATIENT_CLINIC_OR_DEPARTMENT_OTHER): Payer: Medicare Other | Admitting: General Surgery

## 2012-07-11 ENCOUNTER — Ambulatory Visit (INDEPENDENT_AMBULATORY_CARE_PROVIDER_SITE_OTHER): Payer: Medicare Other | Admitting: General Surgery

## 2012-07-11 ENCOUNTER — Other Ambulatory Visit (HOSPITAL_BASED_OUTPATIENT_CLINIC_OR_DEPARTMENT_OTHER): Payer: Medicare Other | Admitting: Lab

## 2012-07-11 ENCOUNTER — Ambulatory Visit
Admission: RE | Admit: 2012-07-11 | Discharge: 2012-07-11 | Disposition: A | Discharge status: Home / Self Care | Payer: Medicare Other | Source: Ambulatory Visit | Attending: Radiation Oncology | Admitting: Radiation Oncology

## 2012-07-11 ENCOUNTER — Ambulatory Visit: Payer: Medicare Other | Attending: General Surgery | Admitting: Physical Therapy

## 2012-07-11 ENCOUNTER — Encounter: Payer: Self-pay | Admitting: *Deleted

## 2012-07-11 ENCOUNTER — Ambulatory Visit: Payer: Medicare Other

## 2012-07-11 VITALS — BP 102/65 | HR 73 | Temp 98.3°F | Ht 62.5 in | Wt 227.1 lb

## 2012-07-11 DIAGNOSIS — C50419 Malignant neoplasm of upper-outer quadrant of unspecified female breast: Secondary | ICD-10-CM

## 2012-07-11 DIAGNOSIS — M6281 Muscle weakness (generalized): Secondary | ICD-10-CM | POA: Insufficient documentation

## 2012-07-11 DIAGNOSIS — M25619 Stiffness of unspecified shoulder, not elsewhere classified: Secondary | ICD-10-CM | POA: Insufficient documentation

## 2012-07-11 DIAGNOSIS — IMO0001 Reserved for inherently not codable concepts without codable children: Secondary | ICD-10-CM | POA: Insufficient documentation

## 2012-07-11 DIAGNOSIS — M25519 Pain in unspecified shoulder: Secondary | ICD-10-CM | POA: Insufficient documentation

## 2012-07-11 LAB — CBC WITH DIFFERENTIAL/PLATELET
Eosinophils Absolute: 0.2 10*3/uL (ref 0.0–0.5)
HCT: 43.2 % (ref 34.8–46.6)
LYMPH%: 26.1 % (ref 14.0–49.7)
MCV: 86 fL (ref 79.5–101.0)
MONO#: 0.5 10*3/uL (ref 0.1–0.9)
MONO%: 6 % (ref 0.0–14.0)
NEUT#: 5.2 10*3/uL (ref 1.5–6.5)
NEUT%: 64.9 % (ref 38.4–76.8)
Platelets: 242 10*3/uL (ref 145–400)
RBC: 5.02 10*6/uL (ref 3.70–5.45)
WBC: 8.1 10*3/uL (ref 3.9–10.3)

## 2012-07-11 LAB — COMPREHENSIVE METABOLIC PANEL
Alkaline Phosphatase: 108 U/L (ref 39–117)
BUN: 8 mg/dL (ref 6–23)
CO2: 27 mEq/L (ref 19–32)
Creatinine, Ser: 0.69 mg/dL (ref 0.50–1.10)
Glucose, Bld: 118 mg/dL — ABNORMAL HIGH (ref 70–99)
Sodium: 134 mEq/L — ABNORMAL LOW (ref 135–145)
Total Bilirubin: 0.6 mg/dL (ref 0.3–1.2)

## 2012-07-11 NOTE — Progress Notes (Signed)
Radiation Oncology         [phone] ________________________________  Initial outpatient Consultation  Name: Elizabeth Lloyd MRN: [id number]  Date: 07/11/2012  DOB: [birth year]  WU:[id number],JYNWG RUTH, MD  Emelia Loron, MD   REFERRING PHYSICIAN: Emelia Loron, MD  DIAGNOSIS: The encounter diagnosis was Cancer of upper-outer quadrant of female breast.  HISTORY OF PRESENT ILLNESS::Elizabeth Lloyd is a 71 y.o. female who is seen as part of the multidisciplinary breast clinic. Earlier this year the patient was undergoing 6 month interval mammogram for suspicious area in the upper outer aspect of left breast. Exam July 10 the patient was noted have focal asymmetric density in this area which seem to have progressed and biopsy was recommended.  this was performed on July 12. 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 MRI of the breast area revealed a solitary enhancing mass within the upper-outer quadrant the left breast.     PREVIOUS RADIATION THERAPY: No  PAST MEDICAL HISTORY:  has a past medical history of Breast cancer; GERD (gastroesophageal reflux disease); Diabetes mellitus; Hypertension; Hyperlipidemia; H/O colonoscopy (06/18/10); H/O bone density study (2005); Wears glasses; Hernia; and Arthritis.    PAST SURGICAL HISTORY: Past Surgical History  Procedure Date  . Cholecystectomy   . Abdominal hysterectomy   . Hand surgery     bilateral  . Knee surgery     left  . Shoulder surgery     Bilateral    FAMILY HISTORY: family history includes Breast cancer in her mother and Colon cancer in her brother.  SOCIAL HISTORY:  reports that she has never smoked. She does not have any smokeless tobacco history on file. She reports that she drinks alcohol. She reports that she does not use illicit drugs.  ALLERGIES: Review of patient's allergies indicates no  known allergies.  MEDICATIONS:  Current Outpatient Prescriptions  Medication Sig Dispense Refill  . albuterol (PROVENTIL HFA;VENTOLIN HFA) 108 (90 BASE) MCG/ACT inhaler Inhale 2 puffs into the lungs every 6 (six) hours as needed.      Marland Kitchen atorvastatin (LIPITOR) 80 MG tablet       . calcium carbonate (OS-CAL) 600 MG TABS Take 600 mg by mouth 2 (two) times daily with a meal.      . ergocalciferol (VITAMIN D2) 50000 UNITS capsule Take 50,000 Units by mouth once a week.      . fish oil-omega-3 fatty acids 1000 MG capsule Take 1,000 g by mouth daily.      Marland Kitchen lisinopril-hydrochlorothiazide (PRINZIDE,ZESTORETIC) 10-12.5 MG per tablet       . NEXIUM 40 MG capsule       . Plant Sterols and Stanols (CHOLEST OFF PO) Take 1 tablet by mouth daily.      . Probiotic Product (ALIGN PO) Take 1 tablet by mouth daily.      . Psyllium 48.57 % POWD Take 10 mLs by mouth daily.        REVIEW OF SYSTEMS:  A 15 point review of systems is documented in the electronic medical record. This was obtained by the nursing staff. However, I reviewed this with the patient to discuss relevant findings and make appropriate changes.  She denies any pain in the left breast and nipple discharge or bleeding. Patient denies any pain in the right breast area nipple discharge or bleeding.  PHYSICAL EXAM:  This is a pleasant female in no acute distress. She is  accompanied by her daughter on evaluation today.  Examination of the neck reveals no palpable adenopathy. The supraclavicular and axillary areas are free of adenopathy. The lungs are clear to auscultation. The heart has a regular rhythm and rate.  right breast reveals no mass or nipple discharge. Examination left breast reveals some bruising from her recent biopsy. There is no dominant mass appreciated breast nipple discharge or bleeding.   LABORATORY DATA:  Lab Results  Component Value Date   WBC 8.1 07/11/2012   HGB 14.6 07/11/2012   HCT 43.2 07/11/2012   MCV 86.0 07/11/2012   PLT  242 07/11/2012   Lab Results  Component Value Date   NA 134* 07/11/2012   K 3.9 07/11/2012   CL 95* 07/11/2012   CO2 27 07/11/2012   Lab Results  Component Value Date   ALT 20 07/11/2012   AST 19 07/11/2012   ALKPHOS 108 07/11/2012   BILITOT 0.6 07/11/2012     RADIOGRAPHY: US Breast Left  06/27/2012  *RADIOLOGY REPORT*  Clinical Data:  71 year old female - follow-up left breast focal asymmetry.  Also for annual bilateral mammograms.  DIGITAL DIAGNOSTIC BILATERAL MAMMOGRAM WITH CAD AND LEFT BREAST ULTRASOUND:  Comparison:  01/20/2012 and prior mammograms dating back to 06/02/2006.  Findings:  Spot compression views of the upper and outer left breast and routine views of both breasts again demonstrate primarily fatty breast tissue bilaterally. The 9 mm focal asymmetric density within the upper outer left breast appears more irregular than on prior studies.  There are punctate calcifications also associated with this focal asymmetry. No other suspicious mass, distortion or worrisome calcifications are noted. Mammographic images were processed with CAD.  On physical exam, no palpable abnormalities are identified in the upper or outer left breast.  Ultrasound is performed, showing no definite solid or cystic mass, distortion or abnormal areas of shadowing in the upper outer left breast which correspond to the mammographic finding.  IMPRESSION: Upper outer left breast focal asymmetric density without sonographic correlate.  This density appears more irregular than on the prior study and stereotactic guided biopsy is recommended to exclude neoplasm.  This finding and need for biopsy were discussed with the patient and her questions answered.  She desires stereotactic guided left breast biopsy, which has been scheduled for 06/29/2012.  BI-RADS CATEGORY 4:  Suspicious abnormality - biopsy should be considered.  RECOMMENDATION: Stereotactic guided left breast biopsy, which has been scheduled for 06/29/2012.  Original  Report Authenticated By: Rosendo Gros, M.D.   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  The patient underwent two stereotactic guided biopsies of the upper outer quadrant of the left breast and the clip located more posterior and inferior was associated with the malignancy (however it was felt that the clip was located 2-3 centimeters medial to the mass).  The more superior and anterior clip demonstrated fibrocystic change with microcalcifications without evidence of malignancy.  BUN and creatinine were obtained on site at [facility] at [address] Results:  BUN 7 mg/dL,  Creatinine 1.0 mg/dL.  BILATERAL BREAST MRI WITH AND WITHOUT CONTRAST  Technique: Multiplanar, multisequence MR images of both breasts were obtained prior to and following the intravenous administration of 20ml of multihance with prior mammograms.  Three dimensional images were evaluated at the independent DynaCad workstation.  Comparison:  There is a mild background parenchymal  enhancement pattern.  Findings:  In the upper outer quadrant of the left breast there is a 1.3 x 0.9 x 0.9 cm enhancing spiculated mass, corresponding well with the known malignancy.  The clip artifact is located approximately 1.5 cm medial to the enhancing spiculated mass.  A second clip artifact is seen superior and lateral to the mass from the benign biopsy.  No additional areas of abnormal enhancement are seen in the left breast.  There is no enlarged axillary or internal mammary adenopathy.  No abnormal enhancement is seen in the right breast.  IMPRESSION: Solitary enhancing mass in the upper outer quadrant of the left breast corresponding well with the known malignancy ( there are two clips artifacts in the left breast, the more posterior and inferior clip was placed following biopsy of the known malignancy   and is felt to be located medial to the malignancy).  RECOMMENDATION: Treatment planning is recommended.  THREE-DIMENSIONAL MR IMAGE RENDERING ON INDEPENDENT WORKSTATION:  Three-dimensional MR images were rendered by post-processing of the original MR data on an independent workstation.  The three- dimensional MR images were interpreted, and findings were reported in the accompanying complete MRI report for this study.  BI-RADS CATEGORY 6:  Known biopsy-proven malignancy - appropriate action should be taken.  Original Report Authenticated By: Littie Deeds. Judyann Munson, M.D.   Mm Breast Stereo Biopsy Left  06/29/2012  *RADIOLOGY REPORT*  Clinical Data:  nodule left breast upper outer quadrant not seen by ultrasound  STEREOTACTIC-GUIDED VACUUM ASSISTED BIOPSY OF THE LEFT BREAST AND SPECIMEN RADIOGRAPH  Comparison: Previous exams  I met with the patient and we discussed the procedure of stereotactic-guided biopsy, including benefits and alternatives. We discussed the high likelihood of a successful procedure. We discussed the risks of the procedure, including infection, bleeding, tissue injury, clip migration, and inadequate sampling. Informed, written consent was given.  Using sterile technique, 2% lidocaine, stereotactic guidance, and a 9 gauge vacuum assisted device, biopsy was performed of the nodule using a CC from above approach. At the conclusion of the biopsy, a tissue marker clip was deployed into the biopsy cavity, and follow- up 2-view mammogram demonstrated that the clip and corresponding biopsy cavity were approximately 2.8cm cranial and anterior to the nodule in the ML view, despite projecting over the nodule on the CC view.  Therefore, the above sequence was repeated, except that the ML view was used for targeting, and using the marker clip as an additional landmark for targeting.  Subsequently, a second tissue marker clip was deployed, and repeat follow-up 2-view mammogram demonstrates the clip and associated  biopsy cavity to be 2 - 3cm more medially positioned than anticipated on the CC view, but projecting directly over the nodule on the ML and MLO views.  IMPRESSION: Stereotactic-guided biopsy of nodule in the upper outer quadrant of the left breast.  Two biopsy cavities approximately 2cm apart were created as described above.  Based upon the above discussion, it may be that the focus of interest on the CC view is not exactly the same as what was felt to be the corresponding focus of interest on the MLO and ML views.  However, as described above, adequate sampling is ensured by the two separate biopsies performed as part of this procedure, with marker clips projecting over the areas of interest in both the CC and the ML/MLO projections.  No apparent complications.  Original Report Authenticated By: [email or web address]   Mm Digital Diagnostic Bilat  06/27/2012  *RADIOLOGY REPORT*  Clinical Data:  71 year old female - follow-up left breast focal asymmetry.  Also for annual bilateral mammograms.  DIGITAL DIAGNOSTIC BILATERAL MAMMOGRAM WITH CAD AND LEFT BREAST ULTRASOUND:  Comparison:  01/20/2012 and prior mammograms dating back to 06/02/2006.  Findings:  Spot compression views of the upper and outer left breast and routine views of both breasts again demonstrate primarily fatty breast tissue bilaterally. The 9 mm focal asymmetric density within the upper outer left breast appears more irregular than on prior studies.  There are punctate calcifications also associated with this focal asymmetry. No other suspicious mass, distortion or worrisome calcifications are noted. Mammographic images were processed with CAD.  On physical exam, no palpable abnormalities are identified in the upper or outer left breast.  Ultrasound is performed, showing no definite solid or cystic mass, distortion or abnormal areas of shadowing in the upper outer left breast which correspond to the mammographic finding.  IMPRESSION: Upper outer left breast focal  asymmetric density without sonographic correlate.  This density appears more irregular than on the prior study and stereotactic guided biopsy is recommended to exclude neoplasm.  This finding and need for biopsy were discussed with the patient and her questions answered.  She desires stereotactic guided left breast biopsy, which has been scheduled for 06/29/2012.  BI-RADS CATEGORY 4:  Suspicious abnormality - biopsy should be considered.  RECOMMENDATION: Stereotactic guided left breast biopsy, which has been scheduled for 06/29/2012.  Original Report Authenticated By: Rosendo Gros, M.D.   Mm Radiologist Eval And Mgmt  07/02/2012  *RADIOLOGY REPORT*  ESTABLISHED PATIENT OFFICE VISIT - LEVEL II [phone])  Chief Complaint:  Status post stereotactic guided core biopsy left breast, two sites.  History:  Nodule in the upper outer quadrant of the left breast. Stereotactic guided core biopsy was performed using two different approaches  Exam:  Two biopsy sites in the upper-outer quadrant of the left breast are clean and dry.  There is mild ecchymosis in the upper- outer quadrant.  Pathology: Site #1 shows fibrocystic changes with microcalcifications.  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  The pathology of the second site correlates well with the imaging appearance.  Excision is recommended.  Assessment and Plan:  The patient is scheduled to be seen at multidisciplinary clinic 07/11/2012.  MRI has been scheduled.  The patient is given Transport planner.  Original Report Authenticated By: Patterson Hammersmith, M.D.      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 The patient would appear to be a good candidate for breast conservation therapy. This tumor is estrogen and progesterone receptor positive she may potentially be a candidate for lumpectomy alone as long as she has clear  margins and no lymph node involvement and she does proceed with adjuvant hormonal therapy.  PLAN: Final radiation therapy recommendations will depend on results of her surgical findings.  I spent 40 minutes minutes face to face with the patient and more than 50% of that time was spent in counseling and/or coordination of care.   ------------------------------------------------  Billie Lade, PhD, MD

## 2012-07-11 NOTE — Progress Notes (Signed)
Patient ID: Elizabeth Lloyd, female   DOB: 10/01/1941, 71 y.o.   MRN: [id number]  Chief Complaint  Patient presents with  . Other    breast cancer    HPI Elizabeth Lloyd is a 71 y.o. female.  Referred by Dr Rosalie Gums HPI 82 yof who was follow up for left breast lesion and has a 9 mm left breast lesion on mammography of which there is no u/s correlate.  The MRI showed a 1.3x0.9x0.9 cm left breast lesion but was otherwise normal. She has no complaints referable to her breasts.  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  She presents today for evaluation in the multidisciplinary conference.  Past Medical History  Diagnosis Date  . Breast cancer   . GERD (gastroesophageal reflux disease)   . Diabetes mellitus   . Hypertension   . Hyperlipidemia   . H/O colonoscopy 06/18/10  . H/O bone density study 2005  . Wears glasses   . Hernia   . Arthritis     Past Surgical History  Procedure Date  . Cholecystectomy   . Abdominal hysterectomy   . Hand surgery     bilateral  . Knee surgery     left  . Shoulder surgery     Bilateral    Family History  Problem Relation Age of Onset  . Breast cancer Mother   . Colon cancer Brother     Social History History  Substance Use Topics  . Smoking status: Never Smoker   . Smokeless tobacco: Not on file  . Alcohol Use: Yes    No Known Allergies  Current Outpatient Prescriptions  Medication Sig Dispense Refill  . albuterol (PROVENTIL HFA;VENTOLIN HFA) 108 (90 BASE) MCG/ACT inhaler Inhale 2 puffs into the lungs every 6 (six) hours as needed.      Marland Kitchen atorvastatin (LIPITOR) 80 MG tablet       . calcium carbonate (OS-CAL) 600 MG TABS Take 600 mg by mouth 2 (two) times daily with a meal.      . ergocalciferol (VITAMIN D2) 50000 UNITS capsule Take 50,000 Units by mouth once a week.        . fish oil-omega-3 fatty acids 1000 MG capsule Take 1,000 g by mouth daily.      Marland Kitchen lisinopril-hydrochlorothiazide (PRINZIDE,ZESTORETIC) 10-12.5 MG per tablet       . NEXIUM 40 MG capsule       . Plant Sterols and Stanols (CHOLEST OFF PO) Take 1 tablet by mouth daily.      . Probiotic Product (ALIGN PO) Take 1 tablet by mouth daily.      . Psyllium 48.57 % POWD Take 10 mLs by mouth daily.        Review of Systems Review of Systems  Constitutional: Negative for fever, chills and unexpected weight change.  HENT: Negative for hearing loss, congestion, sore throat, trouble swallowing and voice change.   Eyes: Negative for visual disturbance.  Respiratory: Positive for cough. Negative for wheezing.   Cardiovascular: Negative for chest pain, palpitations and leg swelling.  Gastrointestinal: Negative for nausea, vomiting, abdominal pain, diarrhea, constipation, blood in stool, abdominal distention and anal bleeding.  Genitourinary: Negative for hematuria, vaginal bleeding and difficulty urinating.  Musculoskeletal: Positive for arthralgias.  Skin: Negative for rash and wound.  Neurological: Negative for seizures, syncope and  headaches.  Hematological: Negative for adenopathy. Does not bruise/bleed easily.  Psychiatric/Behavioral: Negative for confusion.    There were no vitals taken for this visit.  Physical Exam Physical Exam  Vitals reviewed. Constitutional: She appears well-developed and well-nourished.  Neck: Neck supple.  Cardiovascular: Normal rate, regular rhythm and normal heart sounds.   Pulmonary/Chest: Effort normal and breath sounds normal. She has no wheezes. She has no rales. Right breast exhibits no inverted nipple, no mass, no nipple discharge, no skin change and no tenderness. Left breast exhibits no inverted nipple, no mass, no nipple discharge, no skin change and no tenderness. Breasts are symmetrical.  Abdominal: Soft.  Lymphadenopathy:    She has no cervical  adenopathy.    She has no axillary adenopathy.       Right: No supraclavicular adenopathy present.       Left: No supraclavicular adenopathy present.    Data Reviewed BILATERAL BREAST MRI WITH AND WITHOUT CONTRAST  Technique: Multiplanar, multisequence MR images of both breasts  were obtained prior to and following the intravenous administration  of 20ml of multihance with prior mammograms. Three dimensional  images were evaluated at the independent DynaCad workstation.  Comparison: There is a mild background parenchymal enhancement  pattern.  Findings: In the upper outer quadrant of the left breast there is  a 1.3 x 0.9 x 0.9 cm enhancing spiculated mass, corresponding well  with the known malignancy. The clip artifact is located  approximately 1.5 cm medial to the enhancing spiculated mass. A  second clip artifact is seen superior and lateral to the mass from  the benign biopsy. No additional areas of abnormal enhancement are  seen in the left breast. There is no enlarged axillary or internal  mammary adenopathy.  No abnormal enhancement is seen in the right breast.  IMPRESSION:  Solitary enhancing mass in the upper outer quadrant of the left  breast corresponding well with the known malignancy ( there are two  clips artifacts in the left breast, the more posterior and inferior  clip was placed following biopsy of the known malignancy and is  felt to be located medial to the malignancy).    Assessment    Clinical stage I left breast cancer    Plan    Left breast wire guided lumpectomy, left axillary sentinel node biopsy   We discussed the staging and pathophysiology of breast cancer. We discussed all of the different options for treatment for breast cancer including surgery, chemotherapy, radiation therapy, Herceptin, and antiestrogen therapy.   We discussed a sentinel lymph node biopsy as she does not appear to having lymph node involvement right now. We discussed the  performance of that with injection of radioactive tracer and blue dye. We discussed that she would have an incision underneath her axillary hairline. We discussed that there is a bout a 10-20% chance of having a positive node with a sentinel lymph node biopsy and we will await the permanent pathology to make any other first further decisions in terms of her treatment. One of these options might be to return to the operating room to perform an axillary lymph node dissection. We discussed about a 1-2% risk lifetime of chronic shoulder pain as well as lymphedema associated with a sentinel lymph node biopsy.  We discussed the options for treatment of the breast cancer which included lumpectomy versus a mastectomy. We discussed the performance of the lumpectomy with a wire placement. We discussed a 10-20% chance of a positive margin requiring reexcision in  the operating room. We also discussed that she may need radiation therapy or antiestrogen therapy or both if she undergoes lumpectomy. We discussed the mastectomy and the postoperative care for that as well. We discussed that there is no difference in her survival whether she undergoes lumpectomy with radiation therapy or antiestrogen therapy versus a mastectomy. There is a slight difference in the local recurrence rate being 3-5% with lumpectomy and about 1% with a mastectomy. We discussed the risks of operation including bleeding, infection, possible reoperation. She understands her further therapy will be based on what her stages at the time of her operation.         Jaana Brodt 07/11/2012, 6:20 PM

## 2012-07-12 ENCOUNTER — Encounter: Payer: Self-pay | Admitting: Oncology

## 2012-07-12 ENCOUNTER — Other Ambulatory Visit (INDEPENDENT_AMBULATORY_CARE_PROVIDER_SITE_OTHER): Payer: Self-pay | Admitting: General Surgery

## 2012-07-12 ENCOUNTER — Encounter: Payer: Self-pay | Admitting: Specialist

## 2012-07-12 DIAGNOSIS — C50419 Malignant neoplasm of upper-outer quadrant of unspecified female breast: Secondary | ICD-10-CM

## 2012-07-12 NOTE — Progress Notes (Signed)
I met the patient in the multidisciplinary clinic and reviewed with her the distress screening.  The patient did not express having significant distress.  I gave her information on both the Breast Cancer Support Group and Reach to Recovery.

## 2012-07-14 NOTE — Progress Notes (Signed)
ID: Elizabeth Lloyd   DOB: [birth year]  MR#: [id number]  WUJ#:[id number]  HISTORY OF PRESENT ILLNESS: The patient had routine screening mammography 06/24/2011 suggesting a possible abnormality in her left breast. Additional views 07/01/2011 showed no evidence of malignancy. There was no palpable abnormality. There was mild nodularity in the lateral quadrants of the left breast, and six-month followup was suggested. This was performed 01/20/2012, and showed stability. On 06/27/2012 the patient had bilateral diagnostic mammography, and this time the 9 mm area of focal asymmetric density in the upper outer left breast appeared more irregular than prior. Ultrasound showed no associated abnormality, and the patient proceeded to stereotactic biopsy 06/29/2012. 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 The patient's subsequent history is as detailed below.  INTERVAL HISTORY: Elizabeth Lloyd was seen at the multidisciplinary breast cancer clinic 07/11/2012, accompanied by her daughter Elizabeth Lloyd.  REVIEW OF SYSTEMS: The patient had no unusual symptoms associated with her left breast. She keeps a dry cough, which tends to be worse in the spring and fall. She has a history of heartburn. She has a hiatal hernia. She has some mild osteoarthritic symptoms, which are not constant or intense. Otherwise a detailed review of systems was noncontributory.  PAST MEDICAL HISTORY: Past Medical History  Diagnosis Date  . Breast cancer   . GERD (gastroesophageal reflux disease)   . Diabetes mellitus   . Hypertension   . Hyperlipidemia   . H/O colonoscopy 06/18/10  . H/O bone density study 2005  . Wears glasses   . Hernia   . Arthritis     PAST SURGICAL HISTORY: Past Surgical History  Procedure Date  . Cholecystectomy   . Abdominal hysterectomy   . Hand surgery     bilateral  . Knee surgery    left  . Shoulder surgery     Bilateral    FAMILY HISTORY Family History  Problem Relation Age of Onset  . Breast cancer Mother   . Colon cancer Brother    the patient's father died from heart disease at the age of 73. The patient's mother lived to be 2. The patient had 7 brothers and 2 sister. One brother had colon cancer, but she does not know at what age it was diagnosed. The patient's mother was diagnosed with colon cancer at the age of 37 and breast cancer at the same time. There is no other history of breast or ovarian cancer in the family.  GYNECOLOGIC HISTORY: Menarche age 70, "can't remember" when she went through the change of life. She is GX P2, first live birth age 79.  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 She is widowed, and lives with her daughter Elizabeth Lloyd in [place]. The patient's daughter Elizabeth Lloyd lives in [place]. The patient has 2 grandchildren age 67 and 16 as of July 2013. She is not a Advice worker.   ADVANCED DIRECTIVES: Not in place, but she tells me she became incapacitated she would want her daughter Elizabeth Lloyd to be the decision-maker. Elizabeth Lloyd can be reached at [phone]  HEALTH MAINTENANCE: History  Substance Use Topics  . Smoking status: Never Smoker   . Smokeless tobacco: Not on file  . Alcohol Use: Yes     Colonoscopy: 2011/ [facility]  PAP: 2012  Bone density: 2005  Lipid panel: Shaune Pollack  No Known Allergies  Current Outpatient Prescriptions  Medication Sig Dispense Refill  . albuterol (  PROVENTIL HFA;VENTOLIN HFA) 108 (90 BASE) MCG/ACT inhaler Inhale 2 puffs into the lungs every 6 (six) hours as needed.      . calcium carbonate (OS-CAL) 600 MG TABS Take 600 mg by mouth 2 (two) times daily with a meal.      . ergocalciferol (VITAMIN D2) 50000 UNITS capsule Take 50,000 Units by mouth once a week.      . fish oil-omega-3 fatty acids 1000 MG capsule Take 1,000 g by mouth daily.      . Plant Sterols and Stanols (CHOLEST OFF  PO) Take 1 tablet by mouth daily.      . Probiotic Product (ALIGN PO) Take 1 tablet by mouth daily.      . Psyllium 48.57 % POWD Take 10 mLs by mouth daily.      Marland Kitchen atorvastatin (LIPITOR) 80 MG tablet       . lisinopril-hydrochlorothiazide (PRINZIDE,ZESTORETIC) 10-12.5 MG per tablet       . NEXIUM 40 MG capsule         OBJECTIVE: Middle-aged white woman who appears comfortable Filed Vitals:   07/11/12 1315  BP: 102/65  Pulse: 73  Temp: 98.3 F (36.8 C)     Body mass index is 40.88 kg/(m^2).    ECOG FS: 0  Sclerae unicteric Oropharynx clear No cervical or supraclavicular adenopathy Lungs no rales or rhonchi Heart regular rate and rhythm Abd benign MSK no focal spinal tenderness, no peripheral edema Neuro: nonfocal Breasts: The right breast is unremarkable. The left breast is status post recent core biopsy. There is a small ecchymosis. There are no skin or nipple changes of concern, and the left axilla is benign to palpation  LAB RESULTS: Lab Results  Component Value Date   WBC 8.1 07/11/2012   NEUTROABS 5.2 07/11/2012   HGB 14.6 07/11/2012   HCT 43.2 07/11/2012   MCV 86.0 07/11/2012   PLT 242 07/11/2012      Chemistry      Component Value Date/Time   NA 134* 07/11/2012 1301   K 3.9 07/11/2012 1301   CL 95* 07/11/2012 1301   CO2 27 07/11/2012 1301   BUN 8 07/11/2012 1301   CREATININE 0.69 07/11/2012 1301      Component Value Date/Time   CALCIUM 9.6 07/11/2012 1301   ALKPHOS 108 07/11/2012 1301   AST 19 07/11/2012 1301   ALT 20 07/11/2012 1301   BILITOT 0.6 07/11/2012 1301       Lab Results  Component Value Date   LABCA2 33 07/11/2012    No components found with this basename: [id number]    No results found for this basename: INR:1;PROTIME:1 in the last 168 hours  Urinalysis No results found for this basename: colorurine, appearanceur, labspec, phurine, glucoseu, hgbur, bilirubinur, ketonesur, proteinur, urobilinogen, nitrite, leukocytesur    STUDIES: US Breast  Left  06/27/2012  *RADIOLOGY REPORT*  Clinical Data:  71 year old female - follow-up left breast focal asymmetry.  Also for annual bilateral mammograms.  DIGITAL DIAGNOSTIC BILATERAL MAMMOGRAM WITH CAD AND LEFT BREAST ULTRASOUND:  Comparison:  01/20/2012 and prior mammograms dating back to 06/02/2006.  Findings:  Spot compression views of the upper and outer left breast and routine views of both breasts again demonstrate primarily fatty breast tissue bilaterally. The 9 mm focal asymmetric density within the upper outer left breast appears more irregular than on prior studies.  There are punctate calcifications also associated with this focal asymmetry. No other suspicious mass, distortion or worrisome calcifications are noted. Mammographic images were processed  with CAD.  On physical exam, no palpable abnormalities are identified in the upper or outer left breast.  Ultrasound is performed, showing no definite solid or cystic mass, distortion or abnormal areas of shadowing in the upper outer left breast which correspond to the mammographic finding.  IMPRESSION: Upper outer left breast focal asymmetric density without sonographic correlate.  This density appears more irregular than on the prior study and stereotactic guided biopsy is recommended to exclude neoplasm.  This finding and need for biopsy were discussed with the patient and her questions answered.  She desires stereotactic guided left breast biopsy, which has been scheduled for 06/29/2012.  BI-RADS CATEGORY 4:  Suspicious abnormality - biopsy should be considered.  RECOMMENDATION: Stereotactic guided left breast biopsy, which has been scheduled for 06/29/2012.  Original Report Authenticated By: Rosendo Gros, M.D.   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  The patient underwent two stereotactic guided biopsies  of the upper outer quadrant of the left breast and the clip located more posterior and inferior was associated with the malignancy (however it was felt that the clip was located 2-3 centimeters medial to the mass).  The more superior and anterior clip demonstrated fibrocystic change with microcalcifications without evidence of malignancy.  BUN and creatinine were obtained on site at [facility] at [address] Results:  BUN 7 mg/dL,  Creatinine 1.0 mg/dL.  BILATERAL BREAST MRI WITH AND WITHOUT CONTRAST  Technique: Multiplanar, multisequence MR images of both breasts were obtained prior to and following the intravenous administration of 20ml of multihance with prior mammograms.  Three dimensional images were evaluated at the independent DynaCad workstation.  Comparison:  There is a mild background parenchymal enhancement pattern.  Findings:  In the upper outer quadrant of the left breast there is a 1.3 x 0.9 x 0.9 cm enhancing spiculated mass, corresponding well with the known malignancy.  The clip artifact is located approximately 1.5 cm medial to the enhancing spiculated mass.  A second clip artifact is seen superior and lateral to the mass from the benign biopsy.  No additional areas of abnormal enhancement are seen in the left breast.  There is no enlarged axillary or internal mammary adenopathy.  No abnormal enhancement is seen in the right breast.  IMPRESSION: Solitary enhancing mass in the upper outer quadrant of the left breast corresponding well with the known malignancy ( there are two clips artifacts in the left breast, the more posterior and inferior clip was placed following biopsy of the known malignancy  and is felt to be located medial to the malignancy).  RECOMMENDATION: Treatment planning is recommended.  THREE-DIMENSIONAL MR IMAGE RENDERING ON INDEPENDENT WORKSTATION:  Three-dimensional MR images were rendered by post-processing of the original MR data on an independent workstation.   The three- dimensional MR images were interpreted, and findings were reported in the accompanying complete MRI report for this study.  BI-RADS CATEGORY 6:  Known biopsy-proven malignancy - appropriate action should be taken.  Original Report Authenticated By: Littie Deeds. Judyann Munson, M.D.   Mm Breast Stereo Biopsy Left  06/29/2012  *RADIOLOGY REPORT*  Clinical Data:  nodule left breast upper outer quadrant not seen by ultrasound  STEREOTACTIC-GUIDED VACUUM ASSISTED BIOPSY OF THE LEFT BREAST AND SPECIMEN RADIOGRAPH  Comparison: Previous exams  I met with the patient and we discussed the procedure of stereotactic-guided biopsy, including  benefits and alternatives. We discussed the high likelihood of a successful procedure. We discussed the risks of the procedure, including infection, bleeding, tissue injury, clip migration, and inadequate sampling. Informed, written consent was given.  Using sterile technique, 2% lidocaine, stereotactic guidance, and a 9 gauge vacuum assisted device, biopsy was performed of the nodule using a CC from above approach. At the conclusion of the biopsy, a tissue marker clip was deployed into the biopsy cavity, and follow- up 2-view mammogram demonstrated that the clip and corresponding biopsy cavity were approximately 2.8cm cranial and anterior to the nodule in the ML view, despite projecting over the nodule on the CC view.  Therefore, the above sequence was repeated, except that the ML view was used for targeting, and using the marker clip as an additional landmark for targeting.  Subsequently, a second tissue marker clip was deployed, and repeat follow-up 2-view mammogram demonstrates the clip and associated biopsy cavity to be 2 - 3cm more medially positioned than anticipated on the CC view, but projecting directly over the nodule on the ML and MLO views.  IMPRESSION: Stereotactic-guided biopsy of nodule in the upper outer quadrant of the left breast.  Two biopsy cavities approximately 2cm  apart were created as described above.  Based upon the above discussion, it may be that the focus of interest on the CC view is not exactly the same as what was felt to be the corresponding focus of interest on the MLO and ML views.  However, as described above, adequate sampling is ensured by the two separate biopsies performed as part of this procedure, with marker clips projecting over the areas of interest in both the CC and the ML/MLO projections.  No apparent complications.  Original Report Authenticated By: [email or web address]   Mm Digital Diagnostic Bilat  06/27/2012  *RADIOLOGY REPORT*  Clinical Data:  71 year old female - follow-up left breast focal asymmetry.  Also for annual bilateral mammograms.  DIGITAL DIAGNOSTIC BILATERAL MAMMOGRAM WITH CAD AND LEFT BREAST ULTRASOUND:  Comparison:  01/20/2012 and prior mammograms dating back to 06/02/2006.  Findings:  Spot compression views of the upper and outer left breast and routine views of both breasts again demonstrate primarily fatty breast tissue bilaterally. The 9 mm focal asymmetric density within the upper outer left breast appears more irregular than on prior studies.  There are punctate calcifications also associated with this focal asymmetry. No other suspicious mass, distortion or worrisome calcifications are noted. Mammographic images were processed with CAD.  On physical exam, no palpable abnormalities are identified in the upper or outer left breast.  Ultrasound is performed, showing no definite solid or cystic mass, distortion or abnormal areas of shadowing in the upper outer left breast which correspond to the mammographic finding.  IMPRESSION: Upper outer left breast focal asymmetric density without sonographic correlate.  This density appears more irregular than on the prior study and stereotactic guided biopsy is recommended to exclude neoplasm.  This finding and need for biopsy were discussed with the patient and her questions answered.  She desires  stereotactic guided left breast biopsy, which has been scheduled for 06/29/2012.  BI-RADS CATEGORY 4:  Suspicious abnormality - biopsy should be considered.  RECOMMENDATION: Stereotactic guided left breast biopsy, which has been scheduled for 06/29/2012.  Original Report Authenticated By: Rosendo Gros, M.D.   Mm Radiologist Eval And Mgmt  07/02/2012  *RADIOLOGY REPORT*  ESTABLISHED PATIENT OFFICE VISIT - LEVEL II [phone])  Chief Complaint:  Status post stereotactic guided core biopsy left breast, two  sites.  History:  Nodule in the upper outer quadrant of the left breast. Stereotactic guided core biopsy was performed using two different approaches  Exam:  Two biopsy sites in the upper-outer quadrant of the left breast are clean and dry.  There is mild ecchymosis in the upper- outer quadrant.  Pathology: Site #1 shows fibrocystic changes with microcalcifications.  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  The pathology of the second site correlates well with the imaging appearance.  Excision is recommended.  Assessment and Plan:  The patient is scheduled to be seen at multidisciplinary clinic 07/11/2012.  MRI has been scheduled.  The patient is given Transport planner.  Original Report Authenticated By: Patterson Hammersmith, M.D.    ASSESSMENT: 71 y.o. [place] woman, status post left breast biopsy 06/29/2012 for a clinical T1 CN0, stage IA invasive ductal carcinoma, grade 1, strongly estrogen and progesterone receptor positive, with an MIB-1 of 13, and no HER-2 amplification.  PLAN: The patient is a very good candidate for breast conservation surgery, with sentinel lymph node sampling. If the final pathology is concordant with the clinical picture, she likely will not need radiation treatment, but only antiestrogen therapy. Even though NCCN guidelines suggest Oncotype assessment and patient's with T1  C. and 0 rest cancer, they also state that for patients 70 or older there is little evidence to make strong recommendations, and with this tumor being consistent with a luminal a subtype of breast cancer, I am comfortable with hormonal therapy only. However a final decision will be made only after we've have the final pathology. Lesha will return to see me early September for a definitive decision.   Reiner Loewen C    07/14/2012

## 2012-07-16 ENCOUNTER — Telehealth: Payer: Self-pay | Admitting: *Deleted

## 2012-07-16 NOTE — Telephone Encounter (Signed)
left voice message to inform the patient of the new date and time for the md appointment

## 2012-07-16 NOTE — Telephone Encounter (Signed)
Left vm for pt to return call regarding [facility] from 07/11/12.

## 2012-07-17 ENCOUNTER — Encounter: Payer: Self-pay | Admitting: *Deleted

## 2012-07-17 NOTE — Progress Notes (Signed)
Mailed after appt letter to pt. 

## 2012-07-19 ENCOUNTER — Encounter (HOSPITAL_BASED_OUTPATIENT_CLINIC_OR_DEPARTMENT_OTHER): Payer: Self-pay | Admitting: *Deleted

## 2012-07-19 NOTE — Progress Notes (Signed)
To come in for all preop testing All preop teaching done

## 2012-07-23 ENCOUNTER — Encounter (HOSPITAL_BASED_OUTPATIENT_CLINIC_OR_DEPARTMENT_OTHER)
Admission: RE | Admit: 2012-07-23 | Discharge: 2012-07-23 | Disposition: A | Discharge status: Home / Self Care | Payer: Medicare Other | Source: Ambulatory Visit | Attending: General Surgery | Admitting: General Surgery

## 2012-07-23 ENCOUNTER — Ambulatory Visit
Admission: RE | Admit: 2012-07-23 | Discharge: 2012-07-23 | Disposition: A | Discharge status: Home / Self Care | Payer: Medicare Other | Source: Ambulatory Visit | Attending: General Surgery | Admitting: General Surgery

## 2012-07-23 LAB — BASIC METABOLIC PANEL
Calcium: 9.4 mg/dL (ref 8.4–10.5)
GFR calc Af Amer: >90 mL/min (ref >90)
GFR calc non Af Amer: 87 mL/min — ABNORMAL LOW (ref >90)
Glucose, Bld: 136 mg/dL — ABNORMAL HIGH (ref 70–99)
Potassium: 3.6 mEq/L (ref 3.5–5.1)
Sodium: 132 mEq/L — ABNORMAL LOW (ref 135–145)

## 2012-07-24 ENCOUNTER — Ambulatory Visit
Admission: RE | Admit: 2012-07-24 | Discharge: 2012-07-24 | Disposition: A | Discharge status: Home / Self Care | Payer: Medicare Other | Source: Ambulatory Visit | Attending: General Surgery | Admitting: General Surgery

## 2012-07-24 ENCOUNTER — Encounter (HOSPITAL_BASED_OUTPATIENT_CLINIC_OR_DEPARTMENT_OTHER): Payer: Self-pay | Admitting: Anesthesiology

## 2012-07-24 ENCOUNTER — Ambulatory Visit (HOSPITAL_BASED_OUTPATIENT_CLINIC_OR_DEPARTMENT_OTHER)
Admission: RE | Admit: 2012-07-24 | Discharge: 2012-07-24 | Disposition: A | Discharge status: Home / Self Care | Payer: Medicare Other | Source: Ambulatory Visit | Attending: General Surgery | Admitting: General Surgery

## 2012-07-24 ENCOUNTER — Ambulatory Visit (HOSPITAL_COMMUNITY)
Admission: RE | Admit: 2012-07-24 | Discharge: 2012-07-24 | Disposition: A | Discharge status: Home / Self Care | Payer: Medicare Other | Source: Ambulatory Visit | Attending: General Surgery | Admitting: General Surgery

## 2012-07-24 ENCOUNTER — Encounter (HOSPITAL_BASED_OUTPATIENT_CLINIC_OR_DEPARTMENT_OTHER)
Admission: RE | Disposition: A | Discharge status: Home / Self Care | Payer: Self-pay | Source: Ambulatory Visit | Attending: General Surgery

## 2012-07-24 ENCOUNTER — Ambulatory Visit (HOSPITAL_BASED_OUTPATIENT_CLINIC_OR_DEPARTMENT_OTHER): Payer: Medicare Other | Admitting: Anesthesiology

## 2012-07-24 ENCOUNTER — Encounter (HOSPITAL_BASED_OUTPATIENT_CLINIC_OR_DEPARTMENT_OTHER): Payer: Self-pay | Admitting: *Deleted

## 2012-07-24 DIAGNOSIS — C50419 Malignant neoplasm of upper-outer quadrant of unspecified female breast: Secondary | ICD-10-CM

## 2012-07-24 DIAGNOSIS — I10 Essential (primary) hypertension: Secondary | ICD-10-CM | POA: Insufficient documentation

## 2012-07-24 DIAGNOSIS — C50919 Malignant neoplasm of unspecified site of unspecified female breast: Secondary | ICD-10-CM

## 2012-07-24 DIAGNOSIS — K219 Gastro-esophageal reflux disease without esophagitis: Secondary | ICD-10-CM | POA: Insufficient documentation

## 2012-07-24 DIAGNOSIS — E785 Hyperlipidemia, unspecified: Secondary | ICD-10-CM | POA: Insufficient documentation

## 2012-07-24 DIAGNOSIS — Z01810 Encounter for preprocedural cardiovascular examination: Secondary | ICD-10-CM | POA: Insufficient documentation

## 2012-07-24 DIAGNOSIS — E119 Type 2 diabetes mellitus without complications: Secondary | ICD-10-CM | POA: Insufficient documentation

## 2012-07-24 DIAGNOSIS — Z01812 Encounter for preprocedural laboratory examination: Secondary | ICD-10-CM | POA: Insufficient documentation

## 2012-07-24 HISTORY — DX: Complete loss of teeth, unspecified cause, unspecified class: K08.109

## 2012-07-24 HISTORY — DX: Presence of dental prosthetic device (complete) (partial): Z97.2

## 2012-07-24 HISTORY — PX: OTHER SURGICAL HISTORY: SHX169

## 2012-07-24 SURGERY — BREAST LUMPECTOMY WITH NEEDLE LOCALIZATION AND AXILLARY SENTINEL LYMPH NODE BX
Anesthesia: General | Site: Breast | Laterality: Left | Wound class: Clean

## 2012-07-24 MED ORDER — METOCLOPRAMIDE HCL 5 MG/ML IJ SOLN
INTRAMUSCULAR | Status: DC | PRN
  Administered 2012-07-24: 10 mg via INTRAVENOUS

## 2012-07-24 MED ORDER — PHENYLEPHRINE HCL 10 MG/ML IJ SOLN
10.0000 mg | INTRAVENOUS | Status: DC | PRN
  Administered 2012-07-24: 50 ug/min via INTRAVENOUS

## 2012-07-24 MED ORDER — DEXAMETHASONE SODIUM PHOSPHATE 4 MG/ML IJ SOLN
INTRAMUSCULAR | Status: DC | PRN
  Administered 2012-07-24: 10 mg via INTRAVENOUS

## 2012-07-24 MED ORDER — OXYCODONE-ACETAMINOPHEN 5-325 MG PO TABS: 1.0000 | ORAL_TABLET | ORAL | Status: AC | PRN

## 2012-07-24 MED ORDER — EPHEDRINE SULFATE 50 MG/ML IJ SOLN
INTRAMUSCULAR | Status: DC | PRN
  Administered 2012-07-24: 10 mg via INTRAVENOUS
  Administered 2012-07-24: 15 mg via INTRAVENOUS

## 2012-07-24 MED ORDER — ONDANSETRON HCL 4 MG/2ML IJ SOLN
INTRAMUSCULAR | Status: DC | PRN
  Administered 2012-07-24: 4 mg via INTRAVENOUS

## 2012-07-24 MED ORDER — METOCLOPRAMIDE HCL 5 MG/ML IJ SOLN: 10.0000 mg | Freq: Once | INTRAMUSCULAR | Status: DC | PRN

## 2012-07-24 MED ORDER — BUPIVACAINE HCL (PF) 0.25 % IJ SOLN
INTRAMUSCULAR | Status: DC | PRN
  Administered 2012-07-24: 20 mL

## 2012-07-24 MED ORDER — TECHNETIUM TC 99M SULFUR COLLOID FILTERED
1.0000 | Freq: Once | INTRAVENOUS | Status: AC | PRN
  Administered 2012-07-24: 1 via INTRADERMAL

## 2012-07-24 MED ORDER — OXYCODONE HCL 5 MG PO TABS: 5.0000 mg | ORAL_TABLET | Freq: Once | ORAL | Status: DC | PRN

## 2012-07-24 MED ORDER — PROPOFOL 10 MG/ML IV EMUL
INTRAVENOUS | Status: DC | PRN
  Administered 2012-07-24: 200 mg via INTRAVENOUS

## 2012-07-24 MED ORDER — OXYCODONE HCL 5 MG/5ML PO SOLN: 5.0000 mg | Freq: Once | ORAL | Status: DC | PRN

## 2012-07-24 MED ORDER — LIDOCAINE HCL (CARDIAC) 20 MG/ML IV SOLN
INTRAVENOUS | Status: DC | PRN
  Administered 2012-07-24: 50 mg via INTRAVENOUS

## 2012-07-24 MED ORDER — FENTANYL CITRATE 0.05 MG/ML IJ SOLN
INTRAMUSCULAR | Status: DC | PRN
  Administered 2012-07-24: 50 ug via INTRAVENOUS

## 2012-07-24 MED ORDER — GLYCOPYRROLATE 0.2 MG/ML IJ SOLN
INTRAMUSCULAR | Status: DC | PRN
  Administered 2012-07-24: 0.2 mg via INTRAVENOUS

## 2012-07-24 MED ORDER — LACTATED RINGERS IV SOLN
INTRAVENOUS | Status: DC
  Administered 2012-07-24 (×2): via INTRAVENOUS

## 2012-07-24 MED ORDER — MIDAZOLAM HCL 2 MG/2ML IJ SOLN
1.0000 mg | INTRAMUSCULAR | Status: DC | PRN
  Administered 2012-07-24: 1 mg via INTRAVENOUS

## 2012-07-24 MED ORDER — HYDROMORPHONE HCL PF 1 MG/ML IJ SOLN: 0.2500 mg | INTRAMUSCULAR | Status: DC | PRN

## 2012-07-24 MED ORDER — ACETAMINOPHEN 10 MG/ML IV SOLN
1000.0000 mg | Freq: Once | INTRAVENOUS | Status: AC
  Administered 2012-07-24: 1000 mg via INTRAVENOUS

## 2012-07-24 MED ORDER — CEFAZOLIN SODIUM-DEXTROSE 2-3 GM-% IV SOLR
2.0000 g | INTRAVENOUS | Status: AC
  Administered 2012-07-24: 2 g via INTRAVENOUS

## 2012-07-24 MED ORDER — FENTANYL CITRATE 0.05 MG/ML IJ SOLN
50.0000 ug | INTRAMUSCULAR | Status: DC | PRN
  Administered 2012-07-24: 50 ug via INTRAVENOUS

## 2012-07-24 SURGICAL SUPPLY — 67 items
ADH SKN CLS APL DERMABOND .7 (GAUZE/BANDAGES/DRESSINGS)
APL SKNCLS STERI-STRIP NONHPOA (GAUZE/BANDAGES/DRESSINGS) ×1
APPLIER CLIP 9.375 MED OPEN (MISCELLANEOUS) ×2
APR CLP MED 9.3 20 MLT OPN (MISCELLANEOUS) ×1
BENZOIN TINCTURE PRP APPL 2/3 (GAUZE/BANDAGES/DRESSINGS) ×2 IMPLANT
BINDER BREAST LRG (GAUZE/BANDAGES/DRESSINGS) IMPLANT
BINDER BREAST MEDIUM (GAUZE/BANDAGES/DRESSINGS) IMPLANT
BINDER BREAST XLRG (GAUZE/BANDAGES/DRESSINGS) ×1 IMPLANT
BINDER BREAST XXLRG (GAUZE/BANDAGES/DRESSINGS) IMPLANT
BLADE SURG 15 STRL LF DISP TIS (BLADE) ×1 IMPLANT
BLADE SURG 15 STRL SS (BLADE) ×2
BNDG COHESIVE 4X5 TAN STRL (GAUZE/BANDAGES/DRESSINGS) IMPLANT
CANISTER SUCTION 1200CC (MISCELLANEOUS) ×2 IMPLANT
CHLORAPREP W/TINT 26ML (MISCELLANEOUS) ×2 IMPLANT
CLIP APPLIE 9.375 MED OPEN (MISCELLANEOUS) ×1 IMPLANT
CLOTH BEACON ORANGE TIMEOUT ST (SAFETY) ×2 IMPLANT
COVER MAYO STAND STRL (DRAPES) ×2 IMPLANT
COVER PROBE W GEL 5X96 (DRAPES) ×2 IMPLANT
COVER TABLE BACK 60X90 (DRAPES) ×2 IMPLANT
DECANTER SPIKE VIAL GLASS SM (MISCELLANEOUS) IMPLANT
DERMABOND ADVANCED (GAUZE/BANDAGES/DRESSINGS)
DERMABOND ADVANCED .7 DNX12 (GAUZE/BANDAGES/DRESSINGS) IMPLANT
DEVICE DUBIN W/COMP PLATE 8390 (MISCELLANEOUS) ×1 IMPLANT
DRAIN CHANNEL 19F RND (DRAIN) IMPLANT
DRAPE LAPAROSCOPIC ABDOMINAL (DRAPES) ×1 IMPLANT
DRAPE U-SHAPE 76X120 STRL (DRAPES) IMPLANT
DRSG TEGADERM 4X4.75 (GAUZE/BANDAGES/DRESSINGS) ×3 IMPLANT
ELECT COATED BLADE 2.86 ST (ELECTRODE) ×2 IMPLANT
ELECT REM PT RETURN 9FT ADLT (ELECTROSURGICAL) ×2
ELECTRODE REM PT RTRN 9FT ADLT (ELECTROSURGICAL) ×1 IMPLANT
EVACUATOR SILICONE 100CC (DRAIN) IMPLANT
GAUZE SPONGE 4X4 12PLY STRL LF (GAUZE/BANDAGES/DRESSINGS) ×1 IMPLANT
GLOVE BIO SURGEON STRL SZ7 (GLOVE) ×3 IMPLANT
GLOVE BIOGEL PI IND STRL 7.5 (GLOVE) ×1 IMPLANT
GLOVE BIOGEL PI INDICATOR 7.5 (GLOVE) ×1
GLOVE ECLIPSE 6.5 STRL STRAW (GLOVE) ×1 IMPLANT
GOWN PREVENTION PLUS XLARGE (GOWN DISPOSABLE) ×3 IMPLANT
KIT MARKER MARGIN INK (KITS) ×2 IMPLANT
NDL HYPO 25X1 1.5 SAFETY (NEEDLE) ×2 IMPLANT
NDL SAFETY ECLIPSE 18X1.5 (NEEDLE) ×1 IMPLANT
NEEDLE HYPO 18GX1.5 SHARP (NEEDLE)
NEEDLE HYPO 25X1 1.5 SAFETY (NEEDLE) ×2 IMPLANT
NS IRRIG 1000ML POUR BTL (IV SOLUTION) ×1 IMPLANT
PACK BASIN DAY SURGERY FS (CUSTOM PROCEDURE TRAY) ×2 IMPLANT
PENCIL BUTTON HOLSTER BLD 10FT (ELECTRODE) ×2 IMPLANT
PIN SAFETY STERILE (MISCELLANEOUS) IMPLANT
SLEEVE SCD COMPRESS KNEE MED (MISCELLANEOUS) ×2 IMPLANT
SPONGE LAP 18X18 X RAY DECT (DISPOSABLE) IMPLANT
SPONGE LAP 4X18 X RAY DECT (DISPOSABLE) ×2 IMPLANT
STAPLER VISISTAT 35W (STAPLE) ×2 IMPLANT
STOCKINETTE IMPERVIOUS LG (DRAPES) IMPLANT
STRIP CLOSURE SKIN 1/2X4 (GAUZE/BANDAGES/DRESSINGS) ×2 IMPLANT
SUT MNCRL AB 4-0 PS2 18 (SUTURE) ×2 IMPLANT
SUT MON AB 5-0 PS2 18 (SUTURE) IMPLANT
SUT SILK 2 0 SH (SUTURE) IMPLANT
SUT VIC AB 2-0 SH 27 (SUTURE) ×4
SUT VIC AB 2-0 SH 27XBRD (SUTURE) ×1 IMPLANT
SUT VIC AB 3-0 SH 27 (SUTURE) ×2
SUT VIC AB 3-0 SH 27X BRD (SUTURE) ×1 IMPLANT
SUT VIC AB 5-0 PS2 18 (SUTURE) IMPLANT
SUT VICRYL AB 3 0 TIES (SUTURE) IMPLANT
SYR CONTROL 10ML LL (SYRINGE) ×3 IMPLANT
TOWEL OR 17X24 6PK STRL BLUE (TOWEL DISPOSABLE) ×2 IMPLANT
TOWEL OR NON WOVEN STRL DISP B (DISPOSABLE) ×2 IMPLANT
TUBE CONNECTING 20X1/4 (TUBING) ×2 IMPLANT
WATER STERILE IRR 1000ML POUR (IV SOLUTION) ×1 IMPLANT
YANKAUER SUCT BULB TIP NO VENT (SUCTIONS) ×2 IMPLANT

## 2012-07-24 NOTE — Interval H&P Note (Signed)
History and Physical Interval Note:  07/24/2012 10:39 AM  Elizabeth Lloyd  has presented today for surgery, with the diagnosis of left breast cancer  The various methods of treatment have been discussed with the patient and family. After consideration of risks, benefits and other options for treatment, the patient has consented to  Procedure(s) (LRB): BREAST LUMPECTOMY WITH NEEDLE LOCALIZATION AND AXILLARY SENTINEL LYMPH NODE BX (Left) as a surgical intervention .  The patient's history has been reviewed, patient examined, no change in status, stable for surgery.  I have reviewed the patient's chart and labs.  Questions were answered to the patient's satisfaction.     Sebastin Perlmutter

## 2012-07-24 NOTE — Op Note (Signed)
Preoperative diagnosis: Clinical stage I left breast cancer Postoperative diagnosis: Same as above Procedure: #1 left breast wire-guided lumpectomy #2 left axillary sentinel lymph node biopsy Surgeon: Dr. Harden Mo Anesthesia: Gen. Specimens: #1 left breast tissue marked with paint #2 left axillary sentinel lymph node x3 with counts of 850, 150, 77 Complications: None Drains: None Sponge and needle count was correct x2 at end of operation Disposition to recovery in stable condition  Indications: This is a 71 year old female who had a left breast mammographic abnormality noted. This was biopsied and shown to be invasive ductal carcinoma. She underwent a thorough preoperative evaluation which showed this to be a clinical stage I left breast cancer. She was seen in our multidisciplinary clinic and we discussed all of her different options. We've decided to pursue breast conservation therapy with a sentinel lymph node biopsy.  Procedure: After informed consent was obtained the patient was first taken to the breast center where she had a wire placed. She had 2 clips in position as well as the mass. I had these mammograms available for my review of the operating room. She was administered 2 g of intravenous cefazolin. Sequential compression devices were placed her legs. She was in place under general anesthesia without complication. Her left breast and axilla were then prepped and draped in the standard sterile surgical fashion. A surgical timeout was performed.  I then made an elliptical incision excising small bit of skin overlying the wire and the wire tract in the left upper outer quadrant. Cautery was then used to remove the wire and the surrounding tissue with what appeared to be clinically adequate margins. I did not take this all the way down to the muscle. Her deep margin is still breast tissue. I did not feel like to get a margin around this mass that I needed to do that. I then removed  this and marked it with paint. A Faxitron mammogram wasn't taken which confirmed removal of both clips, the wire, and the mass. This was also confirmed by radiology. I placed 2 clips deep and one clip in each position around the cavity. I then perform a sentinel node biopsy through the same incision. I entered into her axilla with cautery. I then used the neoprobe to identify the  3 sentinel lymph nodes as listed above. There was no real background radioactivity upon completion. There no palpable nodes either. I then irrigated. Hemostasis observed. I then closed the axillary fascia with 2-0 Vicryl suture. I then closed breast tissue with 2-0 Vicryl suture. I closed the dermis with 3-0 Vicryl and the skin with 4 Monocryl. I infiltrated a total of 20 cc of quarter percent Marcaine. I then placed  a sterile dressing. A breast binder was placed. She tolerated this well was extubated and transferred to recovery.

## 2012-07-24 NOTE — Anesthesia Procedure Notes (Signed)
Procedure Name: LMA Insertion Performed by: Tanay Misuraca W Pre-anesthesia Checklist: Patient identified, Timeout performed, Emergency Drugs available, Suction available and Patient being monitored Patient Re-evaluated:Patient Re-evaluated prior to inductionOxygen Delivery Method: Circle system utilized Preoxygenation: Pre-oxygenation with 100% oxygen Intubation Type: IV induction Ventilation: Mask ventilation without difficulty LMA: LMA with gastric port inserted LMA Size: 4.0 Number of attempts: 1 Placement Confirmation: breath sounds checked- equal and bilateral and positive ETCO2 Tube secured with: Tape Dental Injury: Teeth and Oropharynx as per pre-operative assessment      

## 2012-07-24 NOTE — Transfer of Care (Signed)
Immediate Anesthesia Transfer of Care Note  Patient: Elizabeth Lloyd  Procedure(s) Performed: Procedure(s) (LRB): BREAST LUMPECTOMY WITH NEEDLE LOCALIZATION AND AXILLARY SENTINEL LYMPH NODE BX (Left)  Patient Location: PACU  Anesthesia Type: General  Level of Consciousness: awake and alert   Airway & Oxygen Therapy: Patient Spontanous Breathing and Patient connected to face mask oxygen  Post-op Assessment: Report given to PACU RN and Post -op Vital signs reviewed and stable  Post vital signs: Reviewed and stable  Complications: No apparent anesthesia complications

## 2012-07-24 NOTE — H&P (View-Only) (Signed)
Patient ID: Elizabeth Lloyd, female   DOB: 08/04/1941, 71 y.o.   MRN: [id number]  Chief Complaint  Patient presents with  . Other    breast cancer    HPI Elizabeth Lloyd is a 71 y.o. female.  Referred by Dr Beth Brown HPI 71 yof who was follow up for left breast lesion and has a 9 mm left breast lesion on mammography of which there is no u/s correlate.  The MRI showed a 1.3x0.9x0.9 cm left breast lesion but was otherwise normal. She has no complaints referable to her breasts.  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  She presents today for evaluation in the multidisciplinary conference.  Past Medical History  Diagnosis Date  . Breast cancer   . GERD (gastroesophageal reflux disease)   . Diabetes mellitus   . Hypertension   . Hyperlipidemia   . H/O colonoscopy 06/18/10  . H/O bone density study 2005  . Wears glasses   . Hernia   . Arthritis     Past Surgical History  Procedure Date  . Cholecystectomy   . Abdominal hysterectomy   . Hand surgery     bilateral  . Knee surgery     left  . Shoulder surgery     Bilateral    Family History  Problem Relation Age of Onset  . Breast cancer Mother   . Colon cancer Brother     Social History History  Substance Use Topics  . Smoking status: Never Smoker   . Smokeless tobacco: Not on file  . Alcohol Use: Yes    No Known Allergies  Current Outpatient Prescriptions  Medication Sig Dispense Refill  . albuterol (PROVENTIL HFA;VENTOLIN HFA) 108 (90 BASE) MCG/ACT inhaler Inhale 2 puffs into the lungs every 6 (six) hours as needed.      . atorvastatin (LIPITOR) 80 MG tablet       . calcium carbonate (OS-CAL) 600 MG TABS Take 600 mg by mouth 2 (two) times daily with a meal.      . ergocalciferol (VITAMIN D2) 50000 UNITS capsule Take 50,000 Units by mouth once a week.        . fish oil-omega-3 fatty acids 1000 MG capsule Take 1,000 g by mouth daily.      . lisinopril-hydrochlorothiazide (PRINZIDE,ZESTORETIC) 10-12.5 MG per tablet       . NEXIUM 40 MG capsule       . Plant Sterols and Stanols (CHOLEST OFF PO) Take 1 tablet by mouth daily.      . Probiotic Product (ALIGN PO) Take 1 tablet by mouth daily.      . Psyllium 48.57 % POWD Take 10 mLs by mouth daily.        Review of Systems Review of Systems  Constitutional: Negative for fever, chills and unexpected weight change.  HENT: Negative for hearing loss, congestion, sore throat, trouble swallowing and voice change.   Eyes: Negative for visual disturbance.  Respiratory: Positive for cough. Negative for wheezing.   Cardiovascular: Negative for chest pain, palpitations and leg swelling.  Gastrointestinal: Negative for nausea, vomiting, abdominal pain, diarrhea, constipation, blood in stool, abdominal distention and anal bleeding.  Genitourinary: Negative for hematuria, vaginal bleeding and difficulty urinating.  Musculoskeletal: Positive for arthralgias.  Skin: Negative for rash and wound.  Neurological: Negative for seizures, syncope and   headaches.  Hematological: Negative for adenopathy. Does not bruise/bleed easily.  Psychiatric/Behavioral: Negative for confusion.    There were no vitals taken for this visit.  Physical Exam Physical Exam  Vitals reviewed. Constitutional: She appears well-developed and well-nourished.  Neck: Neck supple.  Cardiovascular: Normal rate, regular rhythm and normal heart sounds.   Pulmonary/Chest: Effort normal and breath sounds normal. She has no wheezes. She has no rales. Right breast exhibits no inverted nipple, no mass, no nipple discharge, no skin change and no tenderness. Left breast exhibits no inverted nipple, no mass, no nipple discharge, no skin change and no tenderness. Breasts are symmetrical.  Abdominal: Soft.  Lymphadenopathy:    She has no cervical  adenopathy.    She has no axillary adenopathy.       Right: No supraclavicular adenopathy present.       Left: No supraclavicular adenopathy present.    Data Reviewed BILATERAL BREAST MRI WITH AND WITHOUT CONTRAST  Technique: Multiplanar, multisequence MR images of both breasts  were obtained prior to and following the intravenous administration  of 20ml of multihance with prior mammograms. Three dimensional  images were evaluated at the independent DynaCad workstation.  Comparison: There is a mild background parenchymal enhancement  pattern.  Findings: In the upper outer quadrant of the left breast there is  a 1.3 x 0.9 x 0.9 cm enhancing spiculated mass, corresponding well  with the known malignancy. The clip artifact is located  approximately 1.5 cm medial to the enhancing spiculated mass. A  second clip artifact is seen superior and lateral to the mass from  the benign biopsy. No additional areas of abnormal enhancement are  seen in the left breast. There is no enlarged axillary or internal  mammary adenopathy.  No abnormal enhancement is seen in the right breast.  IMPRESSION:  Solitary enhancing mass in the upper outer quadrant of the left  breast corresponding well with the known malignancy ( there are two  clips artifacts in the left breast, the more posterior and inferior  clip was placed following biopsy of the known malignancy and is  felt to be located medial to the malignancy).    Assessment    Clinical stage I left breast cancer    Plan    Left breast wire guided lumpectomy, left axillary sentinel node biopsy   We discussed the staging and pathophysiology of breast cancer. We discussed all of the different options for treatment for breast cancer including surgery, chemotherapy, radiation therapy, Herceptin, and antiestrogen therapy.   We discussed a sentinel lymph node biopsy as she does not appear to having lymph node involvement right now. We discussed the  performance of that with injection of radioactive tracer and blue dye. We discussed that she would have an incision underneath her axillary hairline. We discussed that there is a bout a 10-20% chance of having a positive node with a sentinel lymph node biopsy and we will await the permanent pathology to make any other first further decisions in terms of her treatment. One of these options might be to return to the operating room to perform an axillary lymph node dissection. We discussed about a 1-2% risk lifetime of chronic shoulder pain as well as lymphedema associated with a sentinel lymph node biopsy.  We discussed the options for treatment of the breast cancer which included lumpectomy versus a mastectomy. We discussed the performance of the lumpectomy with a wire placement. We discussed a 10-20% chance of a positive margin requiring reexcision in   the operating room. We also discussed that she may need radiation therapy or antiestrogen therapy or both if she undergoes lumpectomy. We discussed the mastectomy and the postoperative care for that as well. We discussed that there is no difference in her survival whether she undergoes lumpectomy with radiation therapy or antiestrogen therapy versus a mastectomy. There is a slight difference in the local recurrence rate being 3-5% with lumpectomy and about 1% with a mastectomy. We discussed the risks of operation including bleeding, infection, possible reoperation. She understands her further therapy will be based on what her stages at the time of her operation.         Nell Gales 07/11/2012, 6:20 PM    

## 2012-07-24 NOTE — Anesthesia Postprocedure Evaluation (Signed)
Anesthesia Post Note  Patient: Elizabeth Lloyd  Procedure(s) Performed: Procedure(s) (LRB): BREAST LUMPECTOMY WITH NEEDLE LOCALIZATION AND AXILLARY SENTINEL LYMPH NODE BX (Left)  Anesthesia type: General  Patient location: PACU  Post pain: Pain level controlled  Post assessment: Patient's Cardiovascular Status Stable  Last Vitals:  Filed Vitals:   07/24/12 1335  BP: 109/68  Pulse: 70  Temp: 36.6 C  Resp: 20    Post vital signs: Reviewed and stable  Level of consciousness: alert  Complications: No apparent anesthesia complications

## 2012-07-24 NOTE — Anesthesia Preprocedure Evaluation (Signed)
Anesthesia Evaluation  Patient identified by MRN, date of birth, ID band Patient awake    Reviewed: Allergy & Precautions, H&P , NPO status , Patient's Chart, lab work & pertinent test results, reviewed documented beta blocker date and time   Airway Mallampati: II TM Distance: >3 FB Neck ROM: full    Dental   Pulmonary neg pulmonary ROS,  breath sounds clear to auscultation        Cardiovascular hypertension, On Medications Rhythm:regular     Neuro/Psych negative neurological ROS  negative psych ROS   GI/Hepatic negative GI ROS, Neg liver ROS, GERD-  Medicated and Controlled,  Endo/Other  negative endocrine ROSOral Hypoglycemic AgentsMorbid obesity  Renal/GU negative Renal ROS  negative genitourinary   Musculoskeletal   Abdominal   Peds  Hematology negative hematology ROS (+)   Anesthesia Other Findings See surgeon's H&P   Reproductive/Obstetrics negative OB ROS                           Anesthesia Physical Anesthesia Plan  ASA: III  Anesthesia Plan: General   Post-op Pain Management:    Induction: Intravenous  Airway Management Planned: LMA  Additional Equipment:   Intra-op Plan:   Post-operative Plan: Extubation in OR  Informed Consent: I have reviewed the patients History and Physical, chart, labs and discussed the procedure including the risks, benefits and alternatives for the proposed anesthesia with the patient or authorized representative who has indicated his/her understanding and acceptance.   Dental Advisory Given  Plan Discussed with: CRNA and Surgeon  Anesthesia Plan Comments:         Anesthesia Quick Evaluation

## 2012-08-09 ENCOUNTER — Other Ambulatory Visit: Payer: Self-pay | Admitting: Family Medicine

## 2012-08-09 ENCOUNTER — Other Ambulatory Visit (HOSPITAL_COMMUNITY)
Admission: RE | Admit: 2012-08-09 | Discharge: 2012-08-09 | Disposition: A | Discharge status: Home / Self Care | Payer: Medicare Other | Source: Ambulatory Visit | Attending: Family Medicine | Admitting: Family Medicine

## 2012-08-09 DIAGNOSIS — Z124 Encounter for screening for malignant neoplasm of cervix: Secondary | ICD-10-CM | POA: Insufficient documentation

## 2012-08-13 ENCOUNTER — Encounter (INDEPENDENT_AMBULATORY_CARE_PROVIDER_SITE_OTHER): Payer: Self-pay | Admitting: General Surgery

## 2012-08-13 ENCOUNTER — Ambulatory Visit (INDEPENDENT_AMBULATORY_CARE_PROVIDER_SITE_OTHER): Payer: Medicare Other | Admitting: General Surgery

## 2012-08-13 VITALS — BP 110/64 | HR 78 | Resp 16 | Ht 62.5 in | Wt 223.0 lb

## 2012-08-13 DIAGNOSIS — Z09 Encounter for follow-up examination after completed treatment for conditions other than malignant neoplasm: Secondary | ICD-10-CM

## 2012-08-13 NOTE — Progress Notes (Signed)
Subjective:     Patient ID: Elizabeth Lloyd, female   DOB: Feb 21, 1941, 71 y.o.   MRN: [id number]  HPI This is a 71 year old female with a newly diagnosed left breast cancer who underwent a lumpectomy and sentinel node biopsy on the left side through the same incision. This was done on August 6. Her pathology shows invasive ductal carcinoma there is grade 1 spanning 1.3 cm. 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 Her margins are all negative the closest being 7 mm. She has 3 sentinel lymph nodes are negative giving her a stage I left breast cancer. Her hormone receptors are both 100% positive and HER-2/neu is not amplified. She comes in today with some pain in her breast but is otherwise healing well. She is due to see medical oncology next week.  Review of Systems     Objective:   Physical Exam  Pulmonary/Chest:         Assessment:     Stage I left breast cancer status post lumpectomy and sentinel lymph node biopsy    Plan:     She is to see medical oncology next week and in our discussions previously she is a candidate for antiestrogen therapy alone. We discussed her pathology results today. She should be cleared to go back to work without any restrictions on September 9 as she is healing well but not quite ready to go back.

## 2012-08-22 ENCOUNTER — Telehealth: Payer: Self-pay | Admitting: *Deleted

## 2012-08-22 ENCOUNTER — Ambulatory Visit (HOSPITAL_BASED_OUTPATIENT_CLINIC_OR_DEPARTMENT_OTHER): Payer: Medicare Other | Admitting: Oncology

## 2012-08-22 VITALS — BP 107/69 | HR 84 | Temp 98.4°F | Resp 20 | Ht 62.5 in | Wt 219.7 lb

## 2012-08-22 DIAGNOSIS — C50419 Malignant neoplasm of upper-outer quadrant of unspecified female breast: Secondary | ICD-10-CM

## 2012-08-22 DIAGNOSIS — Z170 Estrogen receptor positive status [ER+]: Secondary | ICD-10-CM

## 2012-08-22 MED ORDER — ANASTROZOLE 1 MG PO TABS: 1.0000 mg | ORAL_TABLET | Freq: Every day | ORAL | Status: AC

## 2012-08-22 MED ORDER — ANASTROZOLE 1 MG PO TABS: 1.0000 mg | ORAL_TABLET | Freq: Every day | ORAL | Status: DC

## 2012-08-22 NOTE — Progress Notes (Signed)
ID: Elizabeth Lloyd   DOB: [birth year]  MR#: [id number]  UXL#:[id number]  HISTORY OF PRESENT ILLNESS: The patient had routine screening mammography 06/24/2011 suggesting a possible abnormality in her left breast. Additional views 07/01/2011 showed no evidence of malignancy. There was no palpable abnormality. There was mild nodularity in the lateral quadrants of the left breast, and six-month followup was suggested. This was performed 01/20/2012, and showed stability. On 06/27/2012 the patient had bilateral diagnostic mammography, and this time the 9 mm area of focal asymmetric density in the upper outer left breast appeared more irregular than prior. Ultrasound showed no associated abnormality, and the patient proceeded to stereotactic biopsy 06/29/2012. 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 The patient's subsequent history is as detailed below.  INTERVAL HISTORY: Elizabeth Lloyd returns to review the results of her definitive surgery. Also, she brought me a copy of her healthcare power of attorney and living will documents, which are separately scanned.   REVIEW OF SYSTEMS: She did remarkably well with the surgery, with no problems regarding pain, fever, or bleeding. She is going back to work of next week. She has some urinary leakage symptoms, some shortness of breath when climbing stairs, and some sinus problems, not of which are new or related to her breast cancer diagnosis. Otherwise a detailed review of systems was noncontributory.  PAST MEDICAL HISTORY: Past Medical History  Diagnosis Date  . Breast cancer   . GERD (gastroesophageal reflux disease)   . Diabetes mellitus   . Hypertension   . Hyperlipidemia   . H/O colonoscopy 06/18/10  . H/O bone density study 2005  . Wears glasses   . Hernia   . Arthritis   . Full dentures     PAST SURGICAL HISTORY: Past  Surgical History  Procedure Date  . Cholecystectomy   . Abdominal hysterectomy   . Hand surgery     bilateral  . Knee surgery     left  . Shoulder surgery     Bilateral  . Lt breas lumpectomy 07/24/12    FAMILY HISTORY Family History  Problem Relation Age of Onset  . Breast cancer Mother   . Colon cancer Brother    the patient's father died from heart disease at the age of 50. The patient's mother lived to be 19. The patient had 7 brothers and 2 sister. One brother had colon cancer, but she does not know at what age it was diagnosed. The patient's mother was diagnosed with colon cancer at the age of 24 and breast cancer at the same time. There is no other history of breast or ovarian cancer in the family.  GYNECOLOGIC HISTORY: Menarche age 51, "can't remember" when she went through the change of life. She is GX P2, first live birth age 65.  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 She is widowed, and lives with her daughter Melvenia Beam in [place]. The patient's daughter Garlan Fair lives in [place]. The patient has 2 grandchildren age 36 and 70 as of July 2013. She is not a Advice worker.   ADVANCED DIRECTIVES:  in place, separately scanned. She has named her daughter, Garlan Fair as her healthcare power of attorney. Junious Dresser can be reached at [phone]. The patient also completed and notarized a living will.  HEALTH MAINTENANCE: History  Substance Use Topics  . Smoking status: Never Smoker   . Smokeless tobacco: Not on  file  . Alcohol Use: Yes     Colonoscopy: 2011/ [facility]  PAP: 2012  Bone density: 2005  Lipid panel: Shaune Pollack  No Known Allergies  Current Outpatient Prescriptions  Medication Sig Dispense Refill  . albuterol (PROVENTIL HFA;VENTOLIN HFA) 108 (90 BASE) MCG/ACT inhaler Inhale 2 puffs into the lungs every 6 (six) hours as needed.      Marland Kitchen atorvastatin (LIPITOR) 80 MG tablet       . calcium carbonate (OS-CAL) 600 MG TABS Take 600 mg by  mouth 2 (two) times daily with a meal.      . ergocalciferol (VITAMIN D2) 50000 UNITS capsule Take 50,000 Units by mouth once a week.      . fish oil-omega-3 fatty acids 1000 MG capsule Take 1,000 g by mouth daily.      Marland Kitchen lisinopril-hydrochlorothiazide (PRINZIDE,ZESTORETIC) 10-12.5 MG per tablet       . NEXIUM 40 MG capsule       . Plant Sterols and Stanols (CHOLEST OFF PO) Take 1 tablet by mouth daily.      . Probiotic Product (ALIGN PO) Take 1 tablet by mouth daily.      . Psyllium 48.57 % POWD Take 10 mLs by mouth daily.        OBJECTIVE: Middle-aged white woman in no acute distress Filed Vitals:   08/22/12 1339  BP: 107/69  Pulse: 84  Temp: 98.4 F (36.9 C)  Resp: 20     Body mass index is 39.54 kg/(m^2).    ECOG FS: 0  Sclerae unicteric Oropharynx clear No cervical or supraclavicular adenopathy Lungs no rales or rhonchi Heart regular rate and rhythm Abd benign MSK no focal spinal tenderness, no peripheral edema Neuro: nonfocal Breasts: The right breast is unremarkable. The left breast is status post recent lumpectomy. The incisions are healing very nicely. There is no significant induration, dehiscence, erythema, or tenderness.   LAB RESULTS: Lab Results  Component Value Date   WBC 8.1 07/11/2012   NEUTROABS 5.2 07/11/2012   HGB 15.5* 07/24/2012   HCT 43.2 07/11/2012   MCV 86.0 07/11/2012   PLT 242 07/11/2012      Chemistry      Component Value Date/Time   NA 132* 07/23/2012 0911   K 3.6 07/23/2012 0911   CL 96 07/23/2012 0911   CO2 27 07/23/2012 0911   BUN 8 07/23/2012 0911   CREATININE 0.68 07/23/2012 0911      Component Value Date/Time   CALCIUM 9.4 07/23/2012 0911   ALKPHOS 108 07/11/2012 1301   AST 19 07/11/2012 1301   ALT 20 07/11/2012 1301   BILITOT 0.6 07/11/2012 1301       Lab Results  Component Value Date   LABCA2 33 07/11/2012    No components found with this basename: [id number]    No results found for this basename: INR:1;PROTIME:1 in the last 168  hours  Urinalysis No results found for this basename: colorurine,  appearanceur,  labspec,  phurine,  glucoseu,  hgbur,  bilirubinur,  ketonesur,  proteinur,  urobilinogen,  nitrite,  leukocytesur    STUDIES: US Breast Left  06/27/2012  *RADIOLOGY REPORT*  Clinical Data:  71 year old female - follow-up left breast focal asymmetry.  Also for annual bilateral mammograms.  DIGITAL DIAGNOSTIC BILATERAL MAMMOGRAM WITH CAD AND LEFT BREAST ULTRASOUND:  Comparison:  01/20/2012 and prior mammograms dating back to 06/02/2006.  Findings:  Spot compression views of the upper and outer left breast and routine views of both breasts again demonstrate  primarily fatty breast tissue bilaterally. The 9 mm focal asymmetric density within the upper outer left breast appears more irregular than on prior studies.  There are punctate calcifications also associated with this focal asymmetry. No other suspicious mass, distortion or worrisome calcifications are noted. Mammographic images were processed with CAD.  On physical exam, no palpable abnormalities are identified in the upper or outer left breast.  Ultrasound is performed, showing no definite solid or cystic mass, distortion or abnormal areas of shadowing in the upper outer left breast which correspond to the mammographic finding.  IMPRESSION: Upper outer left breast focal asymmetric density without sonographic correlate.  This density appears more irregular than on the prior study and stereotactic guided biopsy is recommended to exclude neoplasm.  This finding and need for biopsy were discussed with the patient and her questions answered.  She desires stereotactic guided left breast biopsy, which has been scheduled for 06/29/2012.  BI-RADS CATEGORY 4:  Suspicious abnormality - biopsy should be considered.  RECOMMENDATION: Stereotactic guided left breast biopsy, which has been scheduled for 06/29/2012.  Original Report Authenticated By: Rosendo Gros, M.D.   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  The patient underwent two stereotactic guided biopsies of the upper outer quadrant of the left breast and the clip located more posterior and inferior was associated with the malignancy (however it was felt that the clip was located 2-3 centimeters medial to the mass).  The more superior and anterior clip demonstrated fibrocystic change with microcalcifications without evidence of malignancy.  BUN and creatinine were obtained on site at [facility] at [address] Results:  BUN 7 mg/dL,  Creatinine 1.0 mg/dL.  BILATERAL BREAST MRI WITH AND WITHOUT CONTRAST  Technique: Multiplanar, multisequence MR images of both breasts were obtained prior to and following the intravenous administration of 20ml of multihance with prior mammograms.  Three dimensional images were evaluated at the independent DynaCad workstation.  Comparison:  There is a mild background parenchymal enhancement pattern.  Findings:  In the upper outer quadrant of the left breast there is a 1.3 x 0.9 x 0.9 cm enhancing spiculated mass, corresponding well with the known malignancy.  The clip artifact is located approximately 1.5 cm medial to the enhancing spiculated mass.  A second clip artifact is seen superior and lateral to the mass from the benign biopsy.  No additional areas of abnormal enhancement are seen in the left breast.  There is no enlarged axillary or internal mammary adenopathy.  No abnormal enhancement is seen in the right breast.  IMPRESSION: Solitary enhancing mass in the upper outer quadrant of the left breast corresponding well with the known malignancy ( there are two clips artifacts in the left breast, the more posterior and inferior clip was placed following biopsy of the known malignancy  and is felt to be located medial to the malignancy).   RECOMMENDATION: Treatment planning is recommended.  THREE-DIMENSIONAL MR IMAGE RENDERING ON INDEPENDENT WORKSTATION:  Three-dimensional MR images were rendered by post-processing of the original MR data on an independent workstation.  The three- dimensional MR images were interpreted, and findings were reported in the accompanying complete MRI report for this study.  BI-RADS CATEGORY 6:  Known biopsy-proven malignancy - appropriate action should be taken.  Original Report Authenticated By: Littie Deeds. Judyann Munson, M.D.   Mm Breast Stereo Biopsy Left  06/29/2012  *RADIOLOGY REPORT*  Clinical Data:  nodule left breast upper outer quadrant not seen by ultrasound  STEREOTACTIC-GUIDED VACUUM ASSISTED BIOPSY OF THE LEFT BREAST AND SPECIMEN RADIOGRAPH  Comparison: Previous exams  I met with the patient and we discussed the procedure of stereotactic-guided biopsy, including benefits and alternatives. We discussed the high likelihood of a successful procedure. We discussed the risks of the procedure, including infection, bleeding, tissue injury, clip migration, and inadequate sampling. Informed, written consent was given.  Using sterile technique, 2% lidocaine, stereotactic guidance, and a 9 gauge vacuum assisted device, biopsy was performed of the nodule using a CC from above approach. At the conclusion of the biopsy, a tissue marker clip was deployed into the biopsy cavity, and follow- up 2-view mammogram demonstrated that the clip and corresponding biopsy cavity were approximately 2.8cm cranial and anterior to the nodule in the ML view, despite projecting over the nodule on the CC view.  Therefore, the above sequence was repeated, except that the ML view was used for targeting, and using the marker clip as an additional landmark for targeting.  Subsequently, a second tissue marker clip was deployed, and repeat follow-up 2-view mammogram demonstrates the clip and associated biopsy cavity to be 2 - 3cm more medially positioned  than anticipated on the CC view, but projecting directly over the nodule on the ML and MLO views.  IMPRESSION: Stereotactic-guided biopsy of nodule in the upper outer quadrant of the left breast.  Two biopsy cavities approximately 2cm apart were created as described above.  Based upon the above discussion, it may be that the focus of interest on the CC view is not exactly the same as what was felt to be the corresponding focus of interest on the MLO and ML views.  However, as described above, adequate sampling is ensured by the two separate biopsies performed as part of this procedure, with marker clips projecting over the areas of interest in both the CC and the ML/MLO projections.  No apparent complications.  Original Report Authenticated By: [email or web address]   Mm Digital Diagnostic Bilat  06/27/2012  *RADIOLOGY REPORT*  Clinical Data:  71 year old female - follow-up left breast focal asymmetry.  Also for annual bilateral mammograms.  DIGITAL DIAGNOSTIC BILATERAL MAMMOGRAM WITH CAD AND LEFT BREAST ULTRASOUND:  Comparison:  01/20/2012 and prior mammograms dating back to 06/02/2006.  Findings:  Spot compression views of the upper and outer left breast and routine views of both breasts again demonstrate primarily fatty breast tissue bilaterally. The 9 mm focal asymmetric density within the upper outer left breast appears more irregular than on prior studies.  There are punctate calcifications also associated with this focal asymmetry. No other suspicious mass, distortion or worrisome calcifications are noted. Mammographic images were processed with CAD.  On physical exam, no palpable abnormalities are identified in the upper or outer left breast.  Ultrasound is performed, showing no definite solid or cystic mass, distortion or abnormal areas of shadowing in the upper outer left breast which correspond to the mammographic finding.  IMPRESSION: Upper outer left breast focal asymmetric density without sonographic correlate.   This density appears more irregular than on the prior study and stereotactic guided biopsy is recommended to exclude neoplasm.  This finding and need for biopsy were discussed with the patient and her questions answered.  She desires stereotactic guided left breast biopsy, which has been scheduled for 06/29/2012.  BI-RADS CATEGORY 4:  Suspicious abnormality - biopsy should be considered.  RECOMMENDATION: Stereotactic guided left breast biopsy,  which has been scheduled for 06/29/2012.  Original Report Authenticated By: Rosendo Gros, M.D.   Mm Radiologist Eval And Mgmt  07/02/2012  *RADIOLOGY REPORT*  ESTABLISHED PATIENT OFFICE VISIT - LEVEL II [phone])  Chief Complaint:  Status post stereotactic guided core biopsy left breast, two sites.  History:  Nodule in the upper outer quadrant of the left breast. Stereotactic guided core biopsy was performed using two different approaches  Exam:  Two biopsy sites in the upper-outer quadrant of the left breast are clean and dry.  There is mild ecchymosis in the upper- outer quadrant.  Pathology: Site #1 shows fibrocystic changes with microcalcifications.  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  The pathology of the second site correlates well with the imaging appearance.  Excision is recommended.  Assessment and Plan:  The patient is scheduled to be seen at multidisciplinary clinic 07/11/2012.  MRI has been scheduled.  The patient is given Transport planner.  Original Report Authenticated By: Patterson Hammersmith, M.D.    ASSESSMENT: 71 y.o. [place] woman, status post left lumpectomy and sentinel lymph node biopsy 07/24/2012 for a pT1c pN0, stage IA invasive ductal carcinoma, grade 1, strongly estrogen and progesterone receptor positive, with an MIB-1 of 13, and no HER-2 amplification.  PLAN: The adjuvant! Program would quote her a risk of recurrence in the 16%  range with local treatment only, dropping by 8% with anti-estrogen therapy. The predicted benefit from chemotherapy would be in the 1% range, and so that is not recommended.  Similarly, she has excellent margins, and a low-grade tumor. She understands that would be a small benefit in terms of local recurrence from radiation, but no difference in survival. Accordingly she and I both are comfortable with foregoing the radiation option, as was Dr. Roselind Messier in his consult note.  We then discussed the differences between tamoxifen and anastrozole, and after much discussion we decided we would try anastrozole. I went ahead and wrote her a prescription and also set her up for a bone density to be done within the next 2 months. She will see [place] about 2 months from now. If she is tolerating her anastrozole well the plan will be to do that for 2 to 5 years, depending on results of a second bone density to be done 2 years from now. [place] knows to call for any problems that may develop before the next visit here.   Pervis Macintyre C    08/22/2012

## 2012-08-22 NOTE — Telephone Encounter (Signed)
Gave patient appointment for 10-22-2012 at 1:45pm

## 2012-10-22 ENCOUNTER — Telehealth: Payer: Self-pay | Admitting: *Deleted

## 2012-10-22 ENCOUNTER — Encounter: Payer: Self-pay | Admitting: Physician Assistant

## 2012-10-22 ENCOUNTER — Ambulatory Visit (HOSPITAL_BASED_OUTPATIENT_CLINIC_OR_DEPARTMENT_OTHER): Payer: Medicare Other | Admitting: Physician Assistant

## 2012-10-22 VITALS — BP 110/74 | HR 80 | Temp 97.9°F | Resp 20 | Ht 62.5 in | Wt 223.0 lb

## 2012-10-22 DIAGNOSIS — Z780 Asymptomatic menopausal state: Secondary | ICD-10-CM

## 2012-10-22 DIAGNOSIS — C50419 Malignant neoplasm of upper-outer quadrant of unspecified female breast: Secondary | ICD-10-CM

## 2012-10-22 DIAGNOSIS — Z170 Estrogen receptor positive status [ER+]: Secondary | ICD-10-CM

## 2012-10-22 DIAGNOSIS — M948X9 Other specified disorders of cartilage, unspecified sites: Secondary | ICD-10-CM

## 2012-10-22 NOTE — Telephone Encounter (Signed)
Gave patient appointment for bone density breast center at 11-02-2012 at 3:00pm  Gave patient appointment for lab only on 01-24-2013 at 4:00pm  Gave patient appointment for md on 01-31-2013 at 4:00pm

## 2012-10-22 NOTE — Patient Instructions (Signed)
No change - continue on anastrazole as before.  Bone Density study to be done at [facility] in next few weeks  Return for follow up in Feb 2014  Call with problems or questions   [phone]

## 2012-10-22 NOTE — Progress Notes (Signed)
ID: Elizabeth Lloyd   DOB: 09-Oct-1941  MR#: [id number]  WUJ#:[id number]  HISTORY OF PRESENT ILLNESS: The patient had routine screening mammography 06/24/2011 suggesting a possible abnormality in her left breast. Additional views 07/01/2011 showed no evidence of malignancy. There was no palpable abnormality. There was mild nodularity in the lateral quadrants of the left breast, and six-month followup was suggested. This was performed 01/20/2012, and showed stability. On 06/27/2012 the patient had bilateral diagnostic mammography, and this time the 9 mm area of focal asymmetric density in the upper outer left breast appeared more irregular than prior. Ultrasound showed no associated abnormality, and the patient proceeded to stereotactic biopsy 06/29/2012. 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 The patient's subsequent history is as detailed below.  INTERVAL HISTORY: Elizabeth Lloyd returns today for routine followup of her left breast carcinoma. Interval history is remarkable for Elizabeth Lloyd having started on anastrozole in September, 1 mg daily. She is tolerating the medication well. She has had no hot flashes. She denies any vaginal dryness. She does have chronic knee pain, greater on the right than the left, but this has not worsened since beginning the anastrozole. She denies any additional myalgias or arthralgias.    REVIEW OF SYSTEMS: Elizabeth Lloyd is feeling well. She is working full-time and her energy level is good. She's had no recent illnesses and denies fevers, chills, or night sweats. She's eating and drinking well with no nausea or change in bowel habits. She has some shortness of breath with exertion which is chronic. She's had no new cough or phlegm production. She denies chest pain or palpitations. She's had no abnormal headaches or dizziness.  A detailed review of systems is  otherwise stable and noncontributory.   PAST MEDICAL HISTORY: Past Medical History  Diagnosis Date  . Breast cancer   . GERD (gastroesophageal reflux disease)   . Diabetes mellitus   . Hypertension   . Hyperlipidemia   . H/O colonoscopy 06/18/10  . H/O bone density study 2005  . Wears glasses   . Hernia   . Arthritis   . Full dentures     PAST SURGICAL HISTORY: Past Surgical History  Procedure Date  . Cholecystectomy   . Abdominal hysterectomy   . Hand surgery     bilateral  . Knee surgery     left  . Shoulder surgery     Bilateral  . Lt breas lumpectomy 07/24/12    FAMILY HISTORY Family History  Problem Relation Age of Onset  . Breast cancer Mother   . Colon cancer Brother    the patient's father died from heart disease at the age of 37. The patient's mother lived to be 31. The patient had 7 brothers and 2 sister. One brother had colon cancer, but she does not know at what age it was diagnosed. The patient's mother was diagnosed with colon cancer at the age of 59 and breast cancer at the same time. There is no other history of breast or ovarian cancer in the family.  GYNECOLOGIC HISTORY: Menarche age 49, "can't remember" when she went through the change of life. She is GX P2, first live birth age 48.  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 She is widowed, and lives with her daughter Elizabeth Lloyd in [place]. The patient's daughter Elizabeth Lloyd lives in [place]. The patient has 2 grandchildren age 53 and 23 as of July  2013. She is not a Advice worker.   ADVANCED DIRECTIVES:  in place, separately scanned. She has named her daughter, Elizabeth Lloyd as her healthcare power of attorney. Elizabeth Lloyd can be reached at [phone]. The patient also completed and notarized a living will.  HEALTH MAINTENANCE: History  Substance Use Topics  . Smoking status: Never Smoker   . Smokeless tobacco: Not on file  . Alcohol Use: Yes     Colonoscopy: 2011/ Elizabeth Lloyd  PAP:  2012  Bone density: 2005  Lipid panel: Elizabeth Lloyd  No Known Allergies  Current Outpatient Prescriptions  Medication Sig Dispense Refill  . albuterol (PROVENTIL HFA;VENTOLIN HFA) 108 (90 BASE) MCG/ACT inhaler Inhale 2 puffs into the lungs every 6 (six) hours as needed.      Marland Kitchen aspirin 81 MG tablet Take 81 mg by mouth daily.      Marland Kitchen atorvastatin (LIPITOR) 80 MG tablet       . calcium carbonate (OS-CAL) 600 MG TABS Take 600 mg by mouth 2 (two) times daily with a meal.      . ergocalciferol (VITAMIN D2) 50000 UNITS capsule Take 50,000 Units by mouth once a week.      . fish oil-omega-3 fatty acids 1000 MG capsule Take 1,000 g by mouth daily.      Marland Kitchen NEXIUM 40 MG capsule       . Plant Sterols and Stanols (CHOLEST OFF PO) Take 1 tablet by mouth daily.      . Probiotic Product (ALIGN PO) Take 1 tablet by mouth daily.      . Psyllium 48.57 % POWD Take 10 mLs by mouth daily.        OBJECTIVE: Middle-aged white woman in no acute distress Filed Vitals:   10/22/12 1350  BP: 110/74  Pulse: 80  Temp: 97.9 F (36.6 C)  Resp: 20     Body mass index is 40.14 kg/(m^2).    ECOG FS: 0 Filed Weights   10/22/12 1350  Weight: 223 lb (101.152 kg)   Sclerae unicteric Oropharynx clear No cervical or supraclavicular adenopathy Lungs no rales or rhonchi Heart regular rate and rhythm Abd soft, obese, nontender. Positive bowel sounds MSK no focal spinal tenderness, no peripheral edema Neuro: nonfocal, alert and oriented x3 Breasts: The right breast is unremarkable. The left breast is status post lumpectomy with no suspicious nodularity and no evidence of local recurrence. Axillae are benign bilaterally with no adenopathy palpated.  LAB RESULTS: Lab Results  Component Value Date   WBC 8.1 07/11/2012   NEUTROABS 5.2 07/11/2012   HGB 15.5* 07/24/2012   HCT 43.2 07/11/2012   MCV 86.0 07/11/2012   PLT 242 07/11/2012      Chemistry      Component Value Date/Time   NA 132* 07/23/2012 0911   K 3.6 07/23/2012  0911   CL 96 07/23/2012 0911   CO2 27 07/23/2012 0911   BUN 8 07/23/2012 0911   CREATININE 0.68 07/23/2012 0911      Component Value Date/Time   CALCIUM 9.4 07/23/2012 0911   ALKPHOS 108 07/11/2012 1301   AST 19 07/11/2012 1301   ALT 20 07/11/2012 1301   BILITOT 0.6 07/11/2012 1301       Lab Results  Component Value Date   LABCA2 33 07/11/2012    STUDIES:  No results found.   ASSESSMENT: 71 y.o. [place] woman,   (1)  status post left lumpectomy and sentinel lymph node biopsy 07/24/2012 for a pT1c pN0, stage IA invasive ductal carcinoma,  grade 1, strongly estrogen and progesterone receptor positive, with an MIB-1 of 13, and no HER-2 amplification.  (2)  started on anastrozole in September 2013  PLAN:  Mahrukh is doing very well with regards to her breast cancer, and is tolerating the anastrozole well. She will continue on this therapy. We will obtain a bone density test within the next couple of months, and she'll return to see [place] in February to review those results. The plan will be to continue anastrozole for 2 to 5 years, depending on results of a second bone density to be done 2 years from now.  Maelys knows to call for any problems that may develop before the next visit here.   Adin Lariccia    10/22/2012

## 2012-11-02 ENCOUNTER — Ambulatory Visit
Admission: RE | Admit: 2012-11-02 | Discharge: 2012-11-02 | Disposition: A | Discharge status: Home / Self Care | Payer: Medicare Other | Source: Ambulatory Visit | Attending: Physician Assistant | Admitting: Physician Assistant

## 2012-11-02 DIAGNOSIS — Z780 Asymptomatic menopausal state: Secondary | ICD-10-CM

## 2012-11-02 DIAGNOSIS — C50419 Malignant neoplasm of upper-outer quadrant of unspecified female breast: Secondary | ICD-10-CM

## 2013-01-24 ENCOUNTER — Other Ambulatory Visit (HOSPITAL_BASED_OUTPATIENT_CLINIC_OR_DEPARTMENT_OTHER): Payer: Medicare Other | Admitting: Lab

## 2013-01-24 DIAGNOSIS — C50419 Malignant neoplasm of upper-outer quadrant of unspecified female breast: Secondary | ICD-10-CM

## 2013-01-24 DIAGNOSIS — Z780 Asymptomatic menopausal state: Secondary | ICD-10-CM

## 2013-01-24 LAB — COMPREHENSIVE METABOLIC PANEL (CC13)
AST: 18 U/L (ref 5–34)
Albumin: 3.4 g/dL — ABNORMAL LOW (ref 3.5–5.0)
Alkaline Phosphatase: 130 U/L (ref 40–150)
Potassium: 3.5 mEq/L (ref 3.5–5.1)
Sodium: 136 mEq/L (ref 136–145)
Total Bilirubin: 0.57 mg/dL (ref 0.20–1.20)
Total Protein: 7 g/dL (ref 6.4–8.3)

## 2013-01-24 LAB — CBC WITH DIFFERENTIAL/PLATELET
BASO%: 0.7 % (ref 0.0–2.0)
EOS%: 2.7 % (ref 0.0–7.0)
Eosinophils Absolute: 0.2 10*3/uL (ref 0.0–0.5)
MCH: 28.4 pg (ref 25.1–34.0)
MCHC: 34.1 g/dL (ref 31.5–36.0)
MCV: 83.1 fL (ref 79.5–101.0)
MONO%: 6.8 % (ref 0.0–14.0)
NEUT#: 5.7 10*3/uL (ref 1.5–6.5)
RBC: 5.01 10*6/uL (ref 3.70–5.45)
RDW: 13.3 % (ref 11.2–14.5)

## 2013-01-31 ENCOUNTER — Ambulatory Visit: Payer: Medicare Other | Admitting: Oncology

## 2013-02-02 ENCOUNTER — Other Ambulatory Visit: Payer: Self-pay

## 2013-02-03 ENCOUNTER — Encounter: Payer: Self-pay | Admitting: Physician Assistant

## 2013-03-13 ENCOUNTER — Ambulatory Visit (HOSPITAL_BASED_OUTPATIENT_CLINIC_OR_DEPARTMENT_OTHER): Payer: Medicare Other | Admitting: Oncology

## 2013-03-13 ENCOUNTER — Telehealth: Payer: Self-pay | Admitting: *Deleted

## 2013-03-13 VITALS — BP 124/76 | HR 61 | Temp 97.9°F | Resp 20 | Ht 62.5 in | Wt 222.1 lb

## 2013-03-13 DIAGNOSIS — C50419 Malignant neoplasm of upper-outer quadrant of unspecified female breast: Secondary | ICD-10-CM

## 2013-03-13 DIAGNOSIS — Z170 Estrogen receptor positive status [ER+]: Secondary | ICD-10-CM

## 2013-03-13 MED ORDER — ANASTROZOLE 1 MG PO TABS: 1.0000 mg | ORAL_TABLET | Freq: Every day | ORAL | Status: DC

## 2013-03-13 NOTE — Progress Notes (Signed)
ID: Elizabeth Lloyd   DOB: 09-17-1941  MR#: [id number]  NWG#:[id number]  PCP: Hollice Espy, MD  HISTORY OF PRESENT ILLNESS: The patient had routine screening mammography 06/24/2011 suggesting a possible abnormality in her left breast. Additional views 07/01/2011 showed no evidence of malignancy. There was no palpable abnormality. There was mild nodularity in the lateral quadrants of the left breast, and six-month followup was suggested. This was performed 01/20/2012, and showed stability. On 06/27/2012 the patient had bilateral diagnostic mammography, and this time the 9 mm area of focal asymmetric density in the upper outer left breast appeared more irregular than prior. Ultrasound showed no associated abnormality, and the patient proceeded to stereotactic biopsy 06/29/2012. 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 The patient's subsequent history is as detailed below.  INTERVAL HISTORY: Elizabeth Lloyd returns today for followup of her left breast carcinoma. The interval history is generally unremarkable. She is doing "fine" with the anastrozole. She has not flashes, no complaints regarding vaginal dryness, and no arthralgias/myalgias different from baseline.    REVIEW OF SYSTEMS: She has been doing some dental work and that's made her hands look a little blue, she says. She still has trouble sleeping. Occasionally she gets crampy pains and she still has problems with her knee. She can't walk long distances. This is an issue regarding her bone density, of course. She is a bit deconditioned, and gets easily short of breath when walking up stairs or up a slope. Otherwise a detailed review of systems today was entirely noncontributory  PAST MEDICAL HISTORY: Past Medical History  Diagnosis Date  . Breast cancer   . GERD (gastroesophageal reflux disease)   . Diabetes  mellitus   . Hypertension   . Hyperlipidemia   . H/O colonoscopy 06/18/10  . H/O bone density study 2005  . Wears glasses   . Hernia   . Arthritis   . Full dentures     PAST SURGICAL HISTORY: Past Surgical History  Procedure Laterality Date  . Cholecystectomy    . Abdominal hysterectomy    . Hand surgery      bilateral  . Knee surgery      left  . Shoulder surgery      Bilateral  . Lt breas lumpectomy  07/24/12    FAMILY HISTORY Family History  Problem Relation Age of Onset  . Breast cancer Mother   . Colon cancer Brother    the patient's father died from heart disease at the age of 57. The patient's mother lived to be 43. The patient had 7 brothers and 2 sister. One brother had colon cancer, but she does not know at what age it was diagnosed. The patient's mother was diagnosed with colon cancer at the age of 54 and breast cancer at the same time. There is no other history of breast or ovarian cancer in the family.  GYNECOLOGIC HISTORY: Menarche age 81, "can't remember" when she went through the change of life. She is GX P2, first live birth age 17.  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 The patient has 2 grandchildren age 55 and 34 as of July 2013. She is not a Advice worker.   ADVANCED DIRECTIVES:  in place, separately scanned. She has named her daughter, Garlan Fair as her healthcare  power of attorney. Junious Dresser can be reached at [phone]. The patient also completed and notarized a living will.  HEALTH MAINTENANCE: History  Substance Use Topics  . Smoking status: Never Smoker   . Smokeless tobacco: Not on file  . Alcohol Use: Yes     Colonoscopy: 2011/ [facility]  PAP: 2012  Bone density: 2005  Lipid panel: Shaune Pollack  No Known Allergies  Current Outpatient Prescriptions  Medication Sig Dispense Refill  . albuterol  (PROVENTIL HFA;VENTOLIN HFA) 108 (90 BASE) MCG/ACT inhaler Inhale 2 puffs into the lungs every 6 (six) hours as needed.      Marland Kitchen aspirin 81 MG tablet Take 81 mg by mouth daily.      Marland Kitchen atorvastatin (LIPITOR) 80 MG tablet       . calcium carbonate (OS-CAL) 600 MG TABS Take 600 mg by mouth 2 (two) times daily with a meal.      . ergocalciferol (VITAMIN D2) 50000 UNITS capsule Take 50,000 Units by mouth once a week.      . fish oil-omega-3 fatty acids 1000 MG capsule Take 1,000 g by mouth daily.      Marland Kitchen NEXIUM 40 MG capsule       . Plant Sterols and Stanols (CHOLEST OFF PO) Take 1 tablet by mouth daily.      . Probiotic Product (ALIGN PO) Take 1 tablet by mouth daily.      . Psyllium 48.57 % POWD Take 10 mLs by mouth daily.       No current facility-administered medications for this visit.    OBJECTIVE: Middle-aged white woman in no acute distress Filed Vitals:   03/13/13 1535  BP: 124/76  Pulse: 61  Temp: 97.9 F (36.6 C)  Resp: 20     Body mass index is 39.95 kg/(m^2).    ECOG FS: 0 Filed Weights   03/13/13 1535  Weight: 222 lb 1.6 oz (100.744 kg)   Middle-aged white woman in no acute distress  Sclerae unicteric Oropharynx clear No cervical or supraclavicular adenopathy Lungs no rales or rhonchi Heart regular rate and rhythm Abd soft, obese, nontender, positive bowel sounds MSK no focal spinal tenderness Neuro: nonfocal, well oriented, pleasant affect Breasts: The right breast is unremarkable. The left breast is status post lumpectomy. There is no evidence of local recurrence. The left axilla is benign.  LAB RESULTS: Lab Results  Component Value Date   WBC 9.1 01/24/2013   NEUTROABS 5.7 01/24/2013   HGB 14.2 01/24/2013   HCT 41.6 01/24/2013   MCV 83.1 01/24/2013   PLT 220 01/24/2013      Chemistry      Component Value Date/Time   NA 136 01/24/2013 1607   NA 132* 07/23/2012 0911   K 3.5 01/24/2013 1607   K 3.6 07/23/2012 0911   CL 100 01/24/2013 1607   CL 96 07/23/2012 0911   CO2 24  01/24/2013 1607   CO2 27 07/23/2012 0911   BUN 14.3 01/24/2013 1607   BUN 8 07/23/2012 0911   CREATININE 0.8 01/24/2013 1607   CREATININE 0.68 07/23/2012 0911      Component Value Date/Time   CALCIUM 9.3 01/24/2013 1607   CALCIUM 9.4 07/23/2012 0911   ALKPHOS 130 01/24/2013 1607   ALKPHOS 108 07/11/2012 1301   AST 18 01/24/2013 1607   AST 19 07/11/2012 1301   ALT 21 01/24/2013 1607   ALT 20 07/11/2012 1301   BILITOT 0.57 01/24/2013 1607   BILITOT 0.6 07/11/2012 1301  Lab Results  Component Value Date   LABCA2 31 01/24/2013    STUDIES:  No results found.    ASSESSMENT: 72 y.o. [place] woman,   (1)  status post left lumpectomy and sentinel lymph node biopsy 07/24/2012 for a pT1c pN0, stage IA invasive ductal carcinoma, grade 1, strongly estrogen and progesterone receptor positive, with an MIB-1 of 13, and no HER-2 amplification.  (2)  started on anastrozole in September 2013  PLAN:  We spent the better part of today's half hour visit discussing osteoporosis issues. I am concerned that she is starting from a low point, and they anastrozole can make that worse. We discussed bisphosphonates, and she already has had all her teeth removed remotely. Accordingly her chance of osteonecrosis of the jaw would be exceedingly low. Sometimes patients treated with zoledronic acid feel a little "fluey" for a day or 2, but I find it a take some extra calcium that day or even the next day some of the symptoms abate.  Elizabeth Lloyd has a good understanding of the possible benefits of zoledronic acid as well as the possible toxicities, side effects and complications. She would like to receive treatment this Friday, since she is "off" and I have arrange for that. I also renewed her anastrozole prescription. She is doing well enough that she can start seeing [place] every 4 months, and in fact after the next visit, if all is well, we will start seeing her on an every 6 month interval.   Suhaas Agena C    03/13/2013

## 2013-03-13 NOTE — Telephone Encounter (Signed)
appts made and printed 

## 2013-03-14 ENCOUNTER — Encounter: Payer: Self-pay | Admitting: Physician Assistant

## 2013-03-15 ENCOUNTER — Other Ambulatory Visit (HOSPITAL_BASED_OUTPATIENT_CLINIC_OR_DEPARTMENT_OTHER): Payer: Medicare Other | Admitting: Lab

## 2013-03-15 ENCOUNTER — Ambulatory Visit (HOSPITAL_BASED_OUTPATIENT_CLINIC_OR_DEPARTMENT_OTHER): Payer: Medicare Other

## 2013-03-15 VITALS — BP 134/65 | HR 59 | Temp 98.7°F | Resp 20

## 2013-03-15 DIAGNOSIS — M810 Age-related osteoporosis without current pathological fracture: Secondary | ICD-10-CM

## 2013-03-15 DIAGNOSIS — C50419 Malignant neoplasm of upper-outer quadrant of unspecified female breast: Secondary | ICD-10-CM

## 2013-03-15 LAB — BASIC METABOLIC PANEL (CC13)
BUN: 8.7 mg/dL (ref 7.0–26.0)
Chloride: 101 mEq/L (ref 98–107)
Glucose: 176 mg/dl — ABNORMAL HIGH (ref 70–99)
Potassium: 3.7 mEq/L (ref 3.5–5.1)
Sodium: 137 mEq/L (ref 136–145)

## 2013-03-15 MED ORDER — SODIUM CHLORIDE 0.9 % IV SOLN
INTRAVENOUS | Status: DC
  Administered 2013-03-15: 16:00:00 via INTRAVENOUS

## 2013-03-15 MED ORDER — ZOLEDRONIC ACID 4 MG/5ML IV CONC: 4.0000 mg | Freq: Once | INTRAVENOUS | Status: DC

## 2013-03-15 MED ORDER — ZOLEDRONIC ACID 4 MG/100ML IV SOLN
4.0000 mg | Freq: Once | INTRAVENOUS | Status: AC
  Administered 2013-03-15: 4 mg via INTRAVENOUS
  Filled 2013-03-15: qty 100

## 2013-03-15 NOTE — Patient Instructions (Addendum)
Zoledronic Acid injection (Paget's Disease, Osteoporosis) What is this medicine? ZOLEDRONIC ACID (ZOE le dron ik AS id) lowers the amount of calcium loss from bone. It is used to treat Paget's disease and osteoporosis in women. This medicine may be used for other purposes; ask your health care provider or pharmacist if you have questions. What should I tell my health care provider before I take this medicine? They need to know if you have any of these conditions: -aspirin-sensitive asthma -dental disease -kidney disease -low levels of calcium in the blood -past surgery on the parathyroid gland or intestines -an unusual or allergic reaction to zoledronic acid, other medicines, foods, dyes, or preservatives -pregnant or trying to get pregnant -breast-feeding How should I use this medicine? This medicine is for infusion into a vein. It is given by a health care professional in a hospital or clinic setting. Talk to your pediatrician regarding the use of this medicine in children. This medicine is not approved for use in children. Overdosage: If you think you have taken too much of this medicine contact a poison control center or emergency room at once. NOTE: This medicine is only for you. Do not share this medicine with others. What if I miss a dose? It is important not to miss your dose. Call your doctor or health care professional if you are unable to keep an appointment. What may interact with this medicine? -certain antibiotics given by injection -NSAIDs, medicines for pain and inflammation, like ibuprofen or naproxen -some diuretics like bumetanide, furosemide -teriparatide This list may not describe all possible interactions. Give your health care provider a list of all the medicines, herbs, non-prescription drugs, or dietary supplements you use. Also tell them if you smoke, drink alcohol, or use illegal drugs. Some items may interact with your medicine. What should I watch for while  using this medicine? Visit your doctor or health care professional for regular checkups. It may be some time before you see the benefit from this medicine. Do not stop taking your medicine unless your doctor tells you to. Your doctor may order blood tests or other tests to see how you are doing. Women should inform their doctor if they wish to become pregnant or think they might be pregnant. There is a potential for serious side effects to an unborn child. Talk to your health care professional or pharmacist for more information. You should make sure that you get enough calcium and vitamin D while you are taking this medicine. Discuss the foods you eat and the vitamins you take with your health care professional. Some people who take this medicine have severe bone, joint, and/or muscle pain. This medicine may also increase your risk for a broken thigh bone. Tell your doctor right away if you have pain in your upper leg or groin. Tell your doctor if you have any pain that does not go away or that gets worse. What side effects may I notice from receiving this medicine? Side effects that you should report to your doctor or health care professional as soon as possible: -allergic reactions like skin rash, itching or hives, swelling of the face, lips, or tongue -breathing problems -changes in vision -feeling faint or lightheaded, falls -jaw burning, cramping, or pain -muscle cramps, stiffness, or weakness -trouble passing urine or change in the amount of urine Side effects that usually do not require medical attention (report to your doctor or health care professional if they continue or are bothersome): -bone, joint, or muscle pain -fever -  irritation at site where injected -loss of appetite -nausea, vomiting -stomach upset -tired This list may not describe all possible side effects. Call your doctor for medical advice about side effects. You may report side effects to FDA at 1-800-FDA-1088. Where  should I keep my medicine? This drug is given in a hospital or clinic and will not be stored at home. NOTE: This sheet is a summary. It may not cover all possible information. If you have questions about this medicine, talk to your doctor, pharmacist, or health care provider.  2013, [employer]. (06/03/2011 9:08:15 AM)  

## 2013-03-18 ENCOUNTER — Telehealth: Payer: Self-pay | Admitting: *Deleted

## 2013-03-18 NOTE — Telephone Encounter (Signed)
Patient called reporting lots of pain to right side in general, to her back, shoulders that started Saturday Morning.  "it's a constant ache and I've tried a heating pad.  Asked if this is related to her zometa received on Friday.    Went to [facility] in [place] which is closer to her.  Reports having CT scan, cxr and other test and a "spot" was noted in her lungs.  Unable to recall which side this "spot" was found.  Reviewed previous cxr's as far back as 2004 and noted mention of a nodule to right mid lung.  Reports pain at a level of five if sitting still.  With movement pain ia ten or higher.  "I was not able to get myself out of bed on Saturday or roll myself over.  Was given generic Norco 5-325 mg take 1 po q8 hrs for pain and generic valium 5 mg take 1 q8 hrs for muscles spasms from ER.  Reports this has helped but makes her drowsy and she couldn't work today.  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  Is taking both at the same time.  Instructed to take Norco during the day and the Valium at night but not to take them at the same time.  Admits to history of arthritis in her back where the pain is.  Will notify providers.

## 2013-03-19 ENCOUNTER — Encounter: Payer: Self-pay | Admitting: Nurse Practitioner

## 2013-03-19 ENCOUNTER — Other Ambulatory Visit: Payer: Self-pay | Admitting: Oncology

## 2013-03-19 ENCOUNTER — Ambulatory Visit (HOSPITAL_BASED_OUTPATIENT_CLINIC_OR_DEPARTMENT_OTHER): Payer: Medicare Other | Admitting: Nurse Practitioner

## 2013-03-19 VITALS — BP 125/78 | HR 71 | Temp 98.3°F | Resp 20 | Ht 62.0 in | Wt 216.8 lb

## 2013-03-19 DIAGNOSIS — R52 Pain, unspecified: Secondary | ICD-10-CM

## 2013-03-19 DIAGNOSIS — C50419 Malignant neoplasm of upper-outer quadrant of unspecified female breast: Secondary | ICD-10-CM

## 2013-03-19 DIAGNOSIS — K5900 Constipation, unspecified: Secondary | ICD-10-CM

## 2013-03-19 DIAGNOSIS — Z170 Estrogen receptor positive status [ER+]: Secondary | ICD-10-CM

## 2013-03-19 NOTE — Progress Notes (Signed)
ID: [name]   DOB: [birth year]  MR#: [id number]  JXB#:[id number]  PCP: Elizabeth Espy, MD GYN:  SU:  OTHER MD:   HISTORY OF PRESENT ILLNESS: The patient had routine screening mammography 06/24/2011 suggesting a possible abnormality in her left breast. Additional views 07/01/2011 showed no evidence of malignancy. There was no palpable abnormality. There was mild nodularity in the lateral quadrants of the left breast, and six-month followup was suggested. This was performed 01/20/2012, and showed stability. On 06/27/2012 the patient had bilateral diagnostic mammography, and this time the 9 mm area of focal asymmetric density in the upper outer left breast appeared more irregular than prior. Ultrasound showed no associated abnormality, and the patient proceeded to stereotactic biopsy 06/29/2012. 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 The patient's subsequent history is as detailed below.   INTERVAL HISTORY: Elizabeth Lloyd presents today for unplanned visit due to pain. She had Zometa infusion last Friday which she tolerated without difficulty. However on Saturday morning she woke up in severe pain on the right side of her body, primarily in her arm, shoulder, ribcage area and across her chest. She had her family take her to the emergency room in [place] at [facility].  She had several labs drawn including CBC, CMP, Cardiac enzymes, etc - all of which were largely within normal limits. She had a CT scan of her chest, ruling out pulmonary embolus and a stable right lower lung nodule was noted. This nodule has been present and essentially unchanged since it was first noted over 10 years ago.   She was ultimately discharged from the ER with a diagnosis of thoracic strain and prescriptions for Norco and Valium. She rates her pain as a "5" when she is not moving and "8  or 9" when she tries to move. She states she has not been able to pass stool since before receiving Zometa despite taking stool softeners. She was advised to try generic Miralax and Dulcolax or glycerin suppositories. The Norco takes about 30 minutes to kick in and then only lasts 1.5 hours. 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 We are happy to provide this to her.   REVIEW OF SYSTEMS: Limited review of systems positive for pain, stiffness, constipation and difficulty moving right arm and shoulder.    PAST MEDICAL HISTORY: Past Medical History  Diagnosis Date  . Breast cancer   . GERD (gastroesophageal reflux disease)   . Diabetes mellitus   . Hypertension   . Hyperlipidemia   . H/O colonoscopy 06/18/10  . H/O bone density study 2005  . Wears glasses   . Hernia   . Arthritis   . Full dentures     PAST SURGICAL HISTORY: Past Surgical History  Procedure Laterality Date  . Cholecystectomy    . Abdominal hysterectomy    . Hand surgery      bilateral  . Knee surgery      left  . Shoulder surgery      Bilateral  . Lt breas lumpectomy  07/24/12    PATHOLOGY:  FAMILY HISTORY Family History  Problem Relation Age of Onset  . Breast cancer Mother   . Colon cancer Brother     GYNECOLOGIC HISTORY:  SOCIAL  HISTORY:    ADVANCED DIRECTIVES:  HEALTH MAINTENANCE: History  Substance Use Topics  . Smoking status: Never Smoker   . Smokeless tobacco: Not on file  . Alcohol Use: Yes     Colonoscopy:  PAP:  Bone density:  Lipid panel:  No Known Allergies  Current Outpatient Prescriptions  Medication Sig Dispense Refill  . albuterol (PROVENTIL HFA;VENTOLIN HFA) 108 (90 BASE) MCG/ACT inhaler Inhale 2 puffs into the lungs every 6 (six) hours as needed.      Marland Kitchen anastrozole (ARIMIDEX) 1 MG tablet Take 1 tablet (1 mg total) by mouth daily.  90 tablet  12   . aspirin 81 MG tablet Take 81 mg by mouth daily.      Marland Kitchen atorvastatin (LIPITOR) 80 MG tablet       . calcium carbonate (OS-CAL) 600 MG TABS Take 600 mg by mouth 2 (two) times daily with a meal.      . ergocalciferol (VITAMIN D2) 50000 UNITS capsule Take 50,000 Units by mouth once a week.      . fish oil-omega-3 fatty acids 1000 MG capsule Take 1,000 g by mouth daily.      Marland Kitchen NEXIUM 40 MG capsule       . Plant Sterols and Stanols (CHOLEST OFF PO) Take 1 tablet by mouth daily.      . Probiotic Product (ALIGN PO) Take 1 tablet by mouth daily.      . Psyllium 48.57 % POWD Take 10 mLs by mouth daily.       No current facility-administered medications for this visit.    OBJECTIVE: Filed Vitals:   03/19/13 1038  BP: 125/78  Pulse: 71  Temp: 98.3 F (36.8 C)  Resp: 20     Body mass index is 39.64 kg/(m^2).    ECOG FS:  Focused physical exam today due to patient's pain and complaints.   MSK: patient able to squeeze and grip equally bilaterally upper extremities; walking in a halting manner Neuro: nonfocal - cranial nerves 2 through 12 grossly intact Breasts: not examined today; deferred   LAB RESULTS: Lab Results  Component Value Date   WBC 9.1 01/24/2013   NEUTROABS 5.7 01/24/2013   HGB 14.2 01/24/2013   HCT 41.6 01/24/2013   MCV 83.1 01/24/2013   PLT 220 01/24/2013      Chemistry      Component Value Date/Time   NA 137 03/15/2013 1445   NA 132* 07/23/2012 0911   K 3.7 03/15/2013 1445   K 3.6 07/23/2012 0911   CL 101 03/15/2013 1445   CL 96 07/23/2012 0911   CO2 27 03/15/2013 1445   CO2 27 07/23/2012 0911   BUN 8.7 03/15/2013 1445   BUN 8 07/23/2012 0911   CREATININE 0.8 03/15/2013 1445   CREATININE 0.68 07/23/2012 0911      Component Value Date/Time   CALCIUM 9.5 03/15/2013 1445   CALCIUM 9.4 07/23/2012 0911   ALKPHOS 130 01/24/2013 1607   ALKPHOS 108 07/11/2012 1301   AST 18 01/24/2013 1607   AST 19 07/11/2012 1301   ALT 21 01/24/2013 1607   ALT 20 07/11/2012 1301   BILITOT 0.57 01/24/2013 1607    BILITOT 0.6 07/11/2012 1301       Lab Results  Component Value Date   LABCA2 31 01/24/2013    No components found with this basename: [id number]    No results found for this basename: INR,  in the last 168 hours  Urinalysis No results found for this basename:  colorurine, appearanceur, labspec, phurine, glucoseu, hgbur, bilirubinur, ketonesur, proteinur, urobilinogen, nitrite, leukocytesur    STUDIES: No results found.  ASSESSMENT: 72 y.o. s/p Zometa infusion last Friday which she tolerated well but woke up with pain on right side the following morning.  Subsequent work up at [facility] ER showed no acute abnormality, normal blood work and she was given Rx's for [employer] and Valium, and a diagnosis of thoracic strain. Dr. Darnelle Catalan examined her -- she is able to passively move her right arm in all directions and is able to get up out of the chair and walk unassisted. Dr. Darnelle Catalan assured her that he is confident this will spontaneously resolve in the next 48 to 72 hours. We also obtained records from [facility] and will scan those into her record. CT scan of her lungs confirmed stable right lower lobe nodule which has been there for 10 years.   PLAN:  We will see back in July as previously planned and discontinue any Zometa infusions moving forward.    JOHNSON,SARAH  AOCNP, NP-C   03/19/2013

## 2013-06-12 ENCOUNTER — Encounter (INDEPENDENT_AMBULATORY_CARE_PROVIDER_SITE_OTHER): Payer: Self-pay | Admitting: General Surgery

## 2013-06-24 ENCOUNTER — Other Ambulatory Visit: Payer: Self-pay | Admitting: Family Medicine

## 2013-06-24 DIAGNOSIS — Z853 Personal history of malignant neoplasm of breast: Secondary | ICD-10-CM

## 2013-06-24 DIAGNOSIS — Z9889 Other specified postprocedural states: Secondary | ICD-10-CM

## 2013-07-04 ENCOUNTER — Ambulatory Visit
Admission: RE | Admit: 2013-07-04 | Discharge: 2013-07-04 | Disposition: A | Discharge status: Home / Self Care | Payer: Medicare Other | Source: Ambulatory Visit | Attending: Family Medicine | Admitting: Family Medicine

## 2013-07-04 DIAGNOSIS — Z9889 Other specified postprocedural states: Secondary | ICD-10-CM

## 2013-07-04 DIAGNOSIS — Z853 Personal history of malignant neoplasm of breast: Secondary | ICD-10-CM

## 2013-07-15 ENCOUNTER — Ambulatory Visit (HOSPITAL_BASED_OUTPATIENT_CLINIC_OR_DEPARTMENT_OTHER): Payer: Medicare Other | Admitting: Physician Assistant

## 2013-07-15 ENCOUNTER — Telehealth: Payer: Self-pay | Admitting: *Deleted

## 2013-07-15 ENCOUNTER — Encounter: Payer: Self-pay | Admitting: Physician Assistant

## 2013-07-15 ENCOUNTER — Ambulatory Visit (HOSPITAL_BASED_OUTPATIENT_CLINIC_OR_DEPARTMENT_OTHER): Payer: Medicare Other | Admitting: Lab

## 2013-07-15 VITALS — BP 137/79 | HR 66 | Temp 97.4°F | Resp 20 | Ht 62.0 in | Wt 219.9 lb

## 2013-07-15 DIAGNOSIS — C50419 Malignant neoplasm of upper-outer quadrant of unspecified female breast: Secondary | ICD-10-CM

## 2013-07-15 LAB — CBC WITH DIFFERENTIAL/PLATELET
Eosinophils Absolute: 0.2 10*3/uL (ref 0.0–0.5)
HCT: 43.1 % (ref 34.8–46.6)
LYMPH%: 23.5 % (ref 14.0–49.7)
MONO#: 0.6 10*3/uL (ref 0.1–0.9)
NEUT#: 6.1 10*3/uL (ref 1.5–6.5)
NEUT%: 67.2 % (ref 38.4–76.8)
Platelets: 204 10*3/uL (ref 145–400)
WBC: 9 10*3/uL (ref 3.9–10.3)

## 2013-07-15 LAB — COMPREHENSIVE METABOLIC PANEL (CC13)
CO2: 26 mEq/L (ref 22–29)
Creatinine: 0.8 mg/dL (ref 0.6–1.1)
Glucose: 119 mg/dl (ref 70–140)
Total Bilirubin: 0.64 mg/dL (ref 0.20–1.20)

## 2013-07-15 MED ORDER — ANASTROZOLE 1 MG PO TABS: 1.0000 mg | ORAL_TABLET | Freq: Every day | ORAL | Status: DC

## 2013-07-15 NOTE — Telephone Encounter (Signed)
appts made and printed...td 

## 2013-07-15 NOTE — Progress Notes (Signed)
ID: Elizabeth Lloyd   DOB: 1941-09-24  MR#: [id number]  YQM#:[id number]  PCP: Hollice Espy, MD  HISTORY OF PRESENT ILLNESS: The patient had routine screening mammography 06/24/2011 suggesting a possible abnormality in her left breast. Additional views 07/01/2011 showed no evidence of malignancy. There was no palpable abnormality. There was mild nodularity in the lateral quadrants of the left breast, and six-month followup was suggested. This was performed 01/20/2012, and showed stability. On 06/27/2012 the patient had bilateral diagnostic mammography, and this time the 9 mm area of focal asymmetric density in the upper outer left breast appeared more irregular than prior. Ultrasound showed no associated abnormality, and the patient proceeded to stereotactic biopsy 06/29/2012. 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 The patient's subsequent history is as detailed below.  INTERVAL HISTORY: Elizabeth Lloyd returns today for followup of her left breast carcinoma. The interval history is generally unremarkable, with the exception of receiving her first dose of zoledronic acid in late March with very poor tolerance. She developed severe pain, primarily affecting the right side of her body, which [facility] [place] It took almost one week for the pain to resolve. She's had no similar pain since that time, and, due to the timing, it was thought to be associated with the zoledronic acid.   Otherwise, Elizabeth Lloyd continues on  anastrozole with good tolerance. She has no hot flashes, vaginal dryness, or increased joint pain.   REVIEW OF SYSTEMS: Denis denies any recent illnesses and has had no fevers or chills. She denies any skin changes and had no abnormal bleeding. Her energy level is fair, but she is deconditioned. She has  some shortness of breath with exertion which is stable. She denies any new cough, phlegm production, or chest pain. She's had no problems with jaw pain since receiving zoledronic acid in March. She's eating and drinking well, and denies any nausea or change in bowel or bladder habits. She's had no abnormal headaches, dizziness, or change in vision. She's currently being treated for some tendinitis in the left wrist, and received a cortisone injection last week which has been somewhat helpful. She continues to have some pain in the left shoulder which is chronic and stable.  A detailed review of systems is otherwise noncontributory.    PAST MEDICAL HISTORY: Past Medical History  Diagnosis Date  . Breast cancer   . GERD (gastroesophageal reflux disease)   . Diabetes mellitus   . Hypertension   . Hyperlipidemia   . H/O colonoscopy 06/18/10  . H/O bone density study 2005  . Wears glasses   . Hernia   . Arthritis   . Full dentures     PAST SURGICAL HISTORY: Past Surgical History  Procedure Laterality Date  . Cholecystectomy    . Abdominal hysterectomy    . Hand surgery      bilateral  . Knee surgery      left  . Shoulder surgery      Bilateral  . Lt breas lumpectomy  07/24/12    FAMILY HISTORY Family History  Problem Relation Age of Onset  . Breast cancer Mother   . Colon cancer Brother    the patient's father died from heart disease at the age of 46. The patient's mother lived to be 27. The patient had 7 brothers and 2 sister. One brother had colon cancer,  but she does not know at what age it was diagnosed. The patient's mother was diagnosed with colon cancer at the age of 6 and breast cancer at the same time. There is no other history of breast or ovarian cancer in the family.  GYNECOLOGIC HISTORY: Menarche age 78, "can't remember" when she went through the change of life. She is GX P2, first live birth age 16.  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 She is  widowed, and lives with her daughter Garlan Fair in [place].  Her daughter Melvenia Beam is also living in [place]. The patient has 2 grandchildren age 6 and 67 as of July 2013. She is not a Advice worker.   ADVANCED DIRECTIVES:  in place, separately scanned. She has named her daughter, Garlan Fair as her healthcare power of attorney. Junious Dresser can be reached at [phone]. The patient also completed and notarized a living will.  HEALTH MAINTENANCE: History  Substance Use Topics  . Smoking status: Never Smoker   . Smokeless tobacco: Never Used  . Alcohol Use: Yes     Colonoscopy: 2011/ [facility]  PAP: 2012  Bone density: 2005  Lipid panel: Shaune Pollack  No Known Allergies  Current Outpatient Prescriptions  Medication Sig Dispense Refill  . albuterol (PROVENTIL HFA;VENTOLIN HFA) 108 (90 BASE) MCG/ACT inhaler Inhale 2 puffs into the lungs every 6 (six) hours as needed.      Marland Kitchen anastrozole (ARIMIDEX) 1 MG tablet Take 1 tablet (1 mg total) by mouth daily.  90 tablet  3  . aspirin 81 MG tablet Take 81 mg by mouth daily.      Marland Kitchen atorvastatin (LIPITOR) 80 MG tablet       . calcium carbonate (OS-CAL) 600 MG TABS Take 600 mg by mouth 2 (two) times daily with a meal.      . docusate sodium (COLACE) 100 MG capsule Take 100 mg by mouth 4 (four) times daily.      . ergocalciferol (VITAMIN D2) 50000 UNITS capsule Take 50,000 Units by mouth once a week.      . fish oil-omega-3 fatty acids 1000 MG capsule Take 1,000 g by mouth daily.      Marland Kitchen losartan-hydrochlorothiazide (HYZAAR) 50-12.5 MG per tablet Take 1 tablet by mouth daily.      . naproxen sodium (ANAPROX) 550 MG tablet Take 550 mg by mouth 2 (two) times daily with a meal.      . omeprazole (PRILOSEC) 40 MG capsule Take 40 mg by mouth daily.      . orphenadrine (NORFLEX) 100 MG tablet Take 100 mg by mouth 2 (two) times daily.      . Plant Sterols and Stanols (CHOLEST OFF PO) Take 1 tablet by mouth daily.      . Probiotic Product (ALIGN PO)  Take 1 tablet by mouth daily.      . Psyllium 48.57 % POWD Take 10 mLs by mouth daily.       No current facility-administered medications for this visit.    OBJECTIVE: Middle-aged white woman in no acute distress Filed Vitals:   07/15/13 1501  BP: 137/79  Pulse: 66  Temp: 97.4 F (36.3 C)  Resp: 20     Body mass index is 40.21 kg/(m^2).    ECOG FS: 0 Filed Weights   07/15/13 1501  Weight: 219 lb 14.4 oz (99.746 kg)   Middle-aged white woman in no acute distress  Sclerae unicteric Oropharynx clear No cervical or supraclavicular adenopathy Lungs clear to auscultation  bilaterally, no rales or rhonchi Heart regular rate and rhythm, no murmur appreciated Abd soft, obese, nontender to palpation, positive bowel sounds MSK no focal spinal tenderness No peripheral edema. There is a stabilizing brace present on the left wrist. Neuro: nonfocal, well oriented, pleasant affect Breasts: The right breast is unremarkable. The left breast is status post lumpectomy. There is no evidence of local recurrence. Axillae are benign bilaterally, with no peripheral adenopathy.   LAB RESULTS: Lab Results  Component Value Date   WBC 9.1 01/24/2013   NEUTROABS 5.7 01/24/2013   HGB 14.2 01/24/2013   HCT 41.6 01/24/2013   MCV 83.1 01/24/2013   PLT 220 01/24/2013      Chemistry      Component Value Date/Time   NA 137 03/15/2013 1445   NA 132* 07/23/2012 0911   K 3.7 03/15/2013 1445   K 3.6 07/23/2012 0911   CL 101 03/15/2013 1445   CL 96 07/23/2012 0911   CO2 27 03/15/2013 1445   CO2 27 07/23/2012 0911   BUN 8.7 03/15/2013 1445   BUN 8 07/23/2012 0911   CREATININE 0.8 03/15/2013 1445   CREATININE 0.68 07/23/2012 0911      Component Value Date/Time   CALCIUM 9.5 03/15/2013 1445   CALCIUM 9.4 07/23/2012 0911   ALKPHOS 130 01/24/2013 1607   ALKPHOS 108 07/11/2012 1301   AST 18 01/24/2013 1607   AST 19 07/11/2012 1301   ALT 21 01/24/2013 1607   ALT 20 07/11/2012 1301   BILITOT 0.57 01/24/2013 1607   BILITOT 0.6 07/11/2012 1301        Lab Results  Component Value Date   LABCA2 31 01/24/2013    STUDIES:  [facility], showed osteoporosis.  [facility] was unremarkable.    ASSESSMENT: 72 y.o. [place] woman,   (1)  status post left lumpectomy and sentinel lymph node biopsy 07/24/2012 for a pT1c pN0, stage IA invasive ductal carcinoma, grade 1, strongly estrogen and progesterone receptor positive, with an MIB-1 of 13, and no HER-2 amplification.  (2)  started on anastrozole in September 2013  (3)  received one dose of zoledronic acid in late March 2014, with very poor tolerance, and severe bone pain. This was subsequently discontinued.   PLAN:  Sarai seems to be doing very well with regards to her breast cancer, and I have refilled her anastrozole for another year. We will check her labs today if she leaves, including a CBC and metabolic panel.   We will continue to see her here on a q. 6 month basis. Accordingly, she'll return to see Dr. Darnelle Catalan for labs and physical exam in late January 2015.  At that point, they will need to discuss future therapy for osteoporosis and she was intolerant of the zoledronic acid.  In the meanwhile, I encouraged Aurianna to continue with her calcium and vitamin D supplements, and also encouraged her to try walking, 30 minutes daily.  Elivia is due for her next mammogram in July 2015, and her next bone density in November of 2015.   She voices understanding and agreement with this plan, and nose as always to call with any changes or problems prior to her next appointment.   Gianni Mihalik    07/15/2013

## 2013-07-24 ENCOUNTER — Other Ambulatory Visit: Payer: Self-pay

## 2013-08-27 ENCOUNTER — Telehealth: Payer: Self-pay | Admitting: Oncology

## 2013-08-27 NOTE — Telephone Encounter (Signed)
Faxed pt office note to Dr. Kevan Ny

## 2013-10-24 ENCOUNTER — Other Ambulatory Visit: Payer: Self-pay

## 2013-11-09 ENCOUNTER — Encounter: Payer: Self-pay | Admitting: Physician Assistant

## 2014-01-13 ENCOUNTER — Other Ambulatory Visit (HOSPITAL_BASED_OUTPATIENT_CLINIC_OR_DEPARTMENT_OTHER): Payer: Medicare Other

## 2014-01-13 ENCOUNTER — Ambulatory Visit (HOSPITAL_BASED_OUTPATIENT_CLINIC_OR_DEPARTMENT_OTHER): Payer: Medicare Other | Admitting: Oncology

## 2014-01-13 ENCOUNTER — Telehealth: Payer: Self-pay | Admitting: *Deleted

## 2014-01-13 VITALS — BP 107/67 | HR 64 | Temp 98.3°F | Resp 18 | Ht 62.0 in | Wt 222.8 lb

## 2014-01-13 DIAGNOSIS — M810 Age-related osteoporosis without current pathological fracture: Secondary | ICD-10-CM

## 2014-01-13 DIAGNOSIS — C50419 Malignant neoplasm of upper-outer quadrant of unspecified female breast: Secondary | ICD-10-CM

## 2014-01-13 DIAGNOSIS — Z170 Estrogen receptor positive status [ER+]: Secondary | ICD-10-CM

## 2014-01-13 DIAGNOSIS — C50412 Malignant neoplasm of upper-outer quadrant of left female breast: Secondary | ICD-10-CM

## 2014-01-13 LAB — COMPREHENSIVE METABOLIC PANEL (CC13)
ALBUMIN: 3.5 g/dL (ref 3.5–5.0)
ALK PHOS: 101 U/L (ref 40–150)
ALT: 24 U/L (ref 0–55)
AST: 21 U/L (ref 5–34)
Anion Gap: 8 mEq/L (ref 3–11)
BUN: 10.9 mg/dL (ref 7.0–26.0)
CO2: 30 mEq/L — ABNORMAL HIGH (ref 22–29)
CREATININE: 0.8 mg/dL (ref 0.6–1.1)
Calcium: 9.6 mg/dL (ref 8.4–10.4)
Chloride: 101 mEq/L (ref 98–109)
Glucose: 124 mg/dl (ref 70–140)
POTASSIUM: 3.8 meq/L (ref 3.5–5.1)
Sodium: 140 mEq/L (ref 136–145)
Total Bilirubin: 0.51 mg/dL (ref 0.20–1.20)
Total Protein: 7.1 g/dL (ref 6.4–8.3)

## 2014-01-13 LAB — CBC WITH DIFFERENTIAL/PLATELET
BASO%: 0.6 % (ref 0.0–2.0)
BASOS ABS: 0.1 10*3/uL (ref 0.0–0.1)
EOS%: 2.5 % (ref 0.0–7.0)
Eosinophils Absolute: 0.2 10*3/uL (ref 0.0–0.5)
HCT: 42.7 % (ref 34.8–46.6)
HEMOGLOBIN: 14.4 g/dL (ref 11.6–15.9)
LYMPH#: 2.6 10*3/uL (ref 0.9–3.3)
LYMPH%: 27.1 % (ref 14.0–49.7)
MCH: 29.1 pg (ref 25.1–34.0)
MCHC: 33.9 g/dL (ref 31.5–36.0)
MCV: 86.1 fL (ref 79.5–101.0)
MONO#: 0.6 10*3/uL (ref 0.1–0.9)
MONO%: 6.3 % (ref 0.0–14.0)
NEUT#: 6 10*3/uL (ref 1.5–6.5)
NEUT%: 63.5 % (ref 38.4–76.8)
Platelets: 212 10*3/uL (ref 145–400)
RBC: 4.96 10*6/uL (ref 3.70–5.45)
RDW: 13.2 % (ref 11.2–14.5)
WBC: 9.4 10*3/uL (ref 3.9–10.3)

## 2014-01-13 MED ORDER — ANASTROZOLE 1 MG PO TABS: 1.0000 mg | ORAL_TABLET | Freq: Every day | ORAL | Status: DC

## 2014-01-13 NOTE — Telephone Encounter (Signed)
appts made and printed...td 

## 2014-01-13 NOTE — Progress Notes (Signed)
ID: Calvert Cantor   DOB: 1941/06/22  MR#: [id number]  BPZ#:[id number]  PCP: Elizabeth Smolder, MD GYN: SU:  OTHER MD: Elizabeth Lloyd   HISTORY OF PRESENT ILLNESS: The patient had routine screening mammography 06/24/2011 suggesting a possible abnormality in her left breast. Additional views 07/01/2011 showed no evidence of malignancy. There was no palpable abnormality. There was mild nodularity in the lateral quadrants of the left breast, and six-month followup was suggested. This was performed 01/20/2012, and showed stability. On 06/27/2012 the patient had bilateral diagnostic mammography, and this time the 9 mm area of focal asymmetric density in the upper outer left breast appeared more irregular than prior. Ultrasound showed no associated abnormality, and the patient proceeded to stereotactic biopsy 06/29/2012. 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 The patient's subsequent history is as detailed below.  INTERVAL HISTORY: Elizabeth Lloyd returns today for followup of her left breast carcinoma. The interval history is unremarkable. She is tolerating anastrozole with no side effects that she is aware of. She base approximately $8 a month for this. 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 She tells me her buses would very understanding". Family is "okay".  REVIEW OF SYSTEMS: Elizabeth Lloyd denies unusual headaches, visual changes, nausea, vomiting, stiff neck, dizziness, or gait imbalance. There has been no cough, phlegm production, or pleurisy, no chest pain or pressure, and no change in bowel or bladder habits. The patient denies fever, rash, bleeding, unexplained fatigue or unexplained weight loss. She status post left knee replacement and when she walks up stairs that knee hurts and she also gets short of breath. She is not exercising regularly. Occasionally she has low back pain,  which is very intermittent and mild. A detailed review of systems was otherwise entirely negative.  PAST MEDICAL HISTORY: Past Medical History  Diagnosis Date  . Breast cancer   . GERD (gastroesophageal reflux disease)   . Diabetes mellitus   . Hypertension   . Hyperlipidemia   . H/O colonoscopy 06/18/10  . H/O bone density study 2005  . Wears glasses   . Hernia   . Arthritis   . Full dentures     PAST SURGICAL HISTORY: Past Surgical History  Procedure Laterality Date  . Cholecystectomy    . Abdominal hysterectomy    . Hand surgery      bilateral  . Knee surgery      left  . Shoulder surgery      Bilateral  . Lt breas lumpectomy  07/24/12    FAMILY HISTORY Family History  Problem Relation Age of Onset  . Breast cancer Mother   . Colon cancer Brother    the patient's father died from heart disease at the age of 23. The patient's mother lived to be 36. The patient had 7 brothers and 2 sister. One brother had colon cancer, but she does not know at what age it was diagnosed. The patient's mother was diagnosed with colon cancer at the age of 27 and breast cancer at the same time. There is no other history of breast or ovarian cancer in the family.  GYNECOLOGIC HISTORY: Menarche age 32, "can'Lloyd remember" when she went through the change of life. She did not take hormone replacement. She is GX P2, first live birth age 66.  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 She is widowed, and lives with her daughter Elizabeth Lloyd in  [place].  Her daughter Elizabeth Lloyd is also living in [place]. The patient has 2 grandchildren age 65 and 88 as of July 2013. She is not a Ambulance person.   ADVANCED DIRECTIVES:  in place, separately scanned. She has named her daughter, Elizabeth Lloyd as her healthcare power of attorney. Elizabeth Lloyd can be reached at [phone]. The patient also completed and notarized a living will.  HEALTH MAINTENANCE: History  Substance Use Topics  . Smoking  status: Never Smoker   . Smokeless tobacco: Never Used  . Alcohol Use: Yes     Colonoscopy: 2011/ [facility]  PAP: 2012  Bone density: 2005  Lipid panel: Elizabeth Lloyd  No Known Allergies  Current Outpatient Prescriptions  Medication Sig Dispense Refill  . albuterol (PROVENTIL HFA;VENTOLIN HFA) 108 (90 BASE) MCG/ACT inhaler Inhale 2 puffs into the lungs every 6 (six) hours as needed.      Marland Kitchen anastrozole (ARIMIDEX) 1 MG tablet Take 1 tablet (1 mg total) by mouth daily.  90 tablet  3  . aspirin 81 MG tablet Take 81 mg by mouth daily.      Marland Kitchen atorvastatin (LIPITOR) 80 MG tablet       . calcium carbonate (OS-CAL) 600 MG TABS Take 600 mg by mouth 2 (two) times daily with a meal.      . docusate sodium (COLACE) 100 MG capsule Take 100 mg by mouth 4 (four) times daily.      . ergocalciferol (VITAMIN D2) 50000 UNITS capsule Take 50,000 Units by mouth once a week.      . fish oil-omega-3 fatty acids 1000 MG capsule Take 1,000 g by mouth daily.      Marland Kitchen losartan-hydrochlorothiazide (HYZAAR) 50-12.5 MG per tablet Take 1 tablet by mouth daily.      . naproxen sodium (ANAPROX) 550 MG tablet Take 550 mg by mouth 2 (two) times daily with a meal.      . omeprazole (PRILOSEC) 40 MG capsule Take 40 mg by mouth daily.      . orphenadrine (NORFLEX) 100 MG tablet Take 100 mg by mouth 2 (two) times daily.      . Plant Sterols and Stanols (CHOLEST OFF PO) Take 1 tablet by mouth daily.      . Probiotic Product (ALIGN PO) Take 1 tablet by mouth daily.      . Psyllium 48.57 % POWD Take 10 mLs by mouth daily.       No current facility-administered medications for this visit.    OBJECTIVE: Middle-aged white woman in no acute distress Filed Vitals:   01/13/14 1429  BP: 107/67  Pulse: 64  Temp: 98.3 F (36.8 C)  Resp: 18     Body mass index is 40.74 kg/(m^2).    ECOG FS: 0 Filed Weights   01/13/14 1429  Weight: 222 lb 12.8 oz (101.061 kg)   Middle-aged white woman who appears stated age Sclerae unicteric, pupils  equal and round Oropharynx clear and moist-- no thrush or other lesions No cervical or supraclavicular adenopathy Lungs no rales or rhonchi Heart regular rate and rhythm Abd soft, nontender, positive bowel sounds MSK no focal spinal tenderness, no upper extremity lymphedema Neuro: nonfocal, well oriented, appropriate affect Breasts: The right breast is unremarkable. The left breast is status post lumpectomy. There is no evidence of recurrence. The left axilla is benign.    LAB RESULTS: Lab Results  Component Value Date   WBC 9.4 01/13/2014   NEUTROABS 6.0 01/13/2014   HGB 14.4 01/13/2014  HCT 42.7 01/13/2014   MCV 86.1 01/13/2014   PLT 212 01/13/2014      Chemistry      Component Value Date/Time   NA 137 07/15/2013 1545   NA 132* 07/23/2012 0911   K 3.7 07/15/2013 1545   K 3.6 07/23/2012 0911   CL 101 03/15/2013 1445   CL 96 07/23/2012 0911   CO2 26 07/15/2013 1545   CO2 27 07/23/2012 0911   BUN 11.8 07/15/2013 1545   BUN 8 07/23/2012 0911   CREATININE 0.8 07/15/2013 1545   CREATININE 0.68 07/23/2012 0911      Component Value Date/Time   CALCIUM 9.7 07/15/2013 1545   CALCIUM 9.4 07/23/2012 0911   ALKPHOS 84 07/15/2013 1545   ALKPHOS 108 07/11/2012 1301   AST 22 07/15/2013 1545   AST 19 07/11/2012 1301   ALT 22 07/15/2013 1545   ALT 20 07/11/2012 1301   BILITOT 0.64 07/15/2013 1545   BILITOT 0.6 07/11/2012 1301       Lab Results  Component Value Date   LABCA2 31 01/24/2013    STUDIES:  [facility], showed osteoporosis.  [facility] was unremarkable.    ASSESSMENT: 73 y.o. Elizabeth Lloyd woman,   (1)  status post left lumpectomy and sentinel lymph node biopsy 07/24/2012 for a pT1c pN0, stage IA invasive ductal carcinoma, grade 1, strongly estrogen and progesterone receptor positive, with an MIB-1 of 13, and no HER-2 amplification.  (2)  started on anastrozole in September 2013  (3)  Osteoporosis;  received one dose of zoledronic acid in late March 2014, with very poor tolerance, (severe bone pain). This was subsequently discontinued.   PLAN:  Aleatha is doing fine from a breast cancer point of view, now 1-1/2 years out from her definitive surgery. She is tolerating the anastrozole with no side effects that she is aware of and she is obtaining it at a very good [facility].  She will be due for mammography again in July. We will see her at that month after that study. She will be due for repeat bone density in September. We will then discuss how to address the osteoporosis question when she returns to see me January of 2016.  I began is a good understanding of the overall plan. She agrees with it. She knows to call treatment in her cases cure. She will call with any problems that may develop before her next visit here.  Dalaina Tates C    01/13/2014

## 2014-05-30 ENCOUNTER — Other Ambulatory Visit: Payer: Self-pay | Admitting: Oncology

## 2014-05-30 DIAGNOSIS — Z853 Personal history of malignant neoplasm of breast: Secondary | ICD-10-CM

## 2014-07-09 ENCOUNTER — Other Ambulatory Visit: Payer: Self-pay | Admitting: Family Medicine

## 2014-07-09 ENCOUNTER — Ambulatory Visit
Admission: RE | Admit: 2014-07-09 | Discharge: 2014-07-09 | Disposition: A | Discharge status: Home / Self Care | Payer: Medicare Other | Source: Ambulatory Visit | Attending: Family Medicine | Admitting: Family Medicine

## 2014-07-09 DIAGNOSIS — M25552 Pain in left hip: Secondary | ICD-10-CM

## 2014-07-11 ENCOUNTER — Ambulatory Visit
Admission: RE | Admit: 2014-07-11 | Discharge: 2014-07-11 | Disposition: A | Discharge status: Home / Self Care | Payer: Medicare Other | Source: Ambulatory Visit | Attending: Oncology | Admitting: Oncology

## 2014-07-11 DIAGNOSIS — Z853 Personal history of malignant neoplasm of breast: Secondary | ICD-10-CM

## 2014-07-14 ENCOUNTER — Ambulatory Visit (HOSPITAL_BASED_OUTPATIENT_CLINIC_OR_DEPARTMENT_OTHER): Payer: Medicare Other | Admitting: Nurse Practitioner

## 2014-07-14 ENCOUNTER — Telehealth: Payer: Self-pay | Admitting: Nurse Practitioner

## 2014-07-14 ENCOUNTER — Other Ambulatory Visit (HOSPITAL_BASED_OUTPATIENT_CLINIC_OR_DEPARTMENT_OTHER): Payer: Medicare Other

## 2014-07-14 VITALS — BP 151/76 | HR 57 | Temp 98.2°F | Resp 18 | Ht 62.0 in | Wt 219.4 lb

## 2014-07-14 DIAGNOSIS — M810 Age-related osteoporosis without current pathological fracture: Secondary | ICD-10-CM

## 2014-07-14 DIAGNOSIS — C50412 Malignant neoplasm of upper-outer quadrant of left female breast: Secondary | ICD-10-CM

## 2014-07-14 DIAGNOSIS — C50419 Malignant neoplasm of upper-outer quadrant of unspecified female breast: Secondary | ICD-10-CM

## 2014-07-14 DIAGNOSIS — Z170 Estrogen receptor positive status [ER+]: Secondary | ICD-10-CM

## 2014-07-14 LAB — CBC WITH DIFFERENTIAL/PLATELET
BASO%: 0.6 % (ref 0.0–2.0)
BASOS ABS: 0.1 10*3/uL (ref 0.0–0.1)
EOS ABS: 0.3 10*3/uL (ref 0.0–0.5)
EOS%: 2.9 % (ref 0.0–7.0)
HCT: 43.3 % (ref 34.8–46.6)
HEMOGLOBIN: 14.3 g/dL (ref 11.6–15.9)
LYMPH%: 25.9 % (ref 14.0–49.7)
MCH: 28.5 pg (ref 25.1–34.0)
MCHC: 33.1 g/dL (ref 31.5–36.0)
MCV: 86.1 fL (ref 79.5–101.0)
MONO#: 0.6 10*3/uL (ref 0.1–0.9)
MONO%: 6.1 % (ref 0.0–14.0)
NEUT%: 64.5 % (ref 38.4–76.8)
NEUTROS ABS: 6.2 10*3/uL (ref 1.5–6.5)
PLATELETS: 213 10*3/uL (ref 145–400)
RBC: 5.03 10*6/uL (ref 3.70–5.45)
RDW: 13.3 % (ref 11.2–14.5)
WBC: 9.6 10*3/uL (ref 3.9–10.3)
lymph#: 2.5 10*3/uL (ref 0.9–3.3)

## 2014-07-14 LAB — COMPREHENSIVE METABOLIC PANEL (CC13)
ALBUMIN: 3.5 g/dL (ref 3.5–5.0)
ALK PHOS: 115 U/L (ref 40–150)
ALT: 21 U/L (ref 0–55)
AST: 20 U/L (ref 5–34)
Anion Gap: 7 mEq/L (ref 3–11)
BILIRUBIN TOTAL: 0.29 mg/dL (ref 0.20–1.20)
BUN: 17 mg/dL (ref 7.0–26.0)
CO2: 30 mEq/L — ABNORMAL HIGH (ref 22–29)
CREATININE: 0.8 mg/dL (ref 0.6–1.1)
Calcium: 9.5 mg/dL (ref 8.4–10.4)
Chloride: 102 mEq/L (ref 98–109)
GLUCOSE: 116 mg/dL (ref 70–140)
Potassium: 3.9 mEq/L (ref 3.5–5.1)
Sodium: 138 mEq/L (ref 136–145)
Total Protein: 6.8 g/dL (ref 6.4–8.3)

## 2014-07-14 NOTE — Progress Notes (Addendum)
ID: Elizabeth Lloyd   DOB: 05/08/1941  MR#: [id number]  CMK#:[id number]  PCP: Elizabeth Smolder, MD GYN: SU:  OTHER MD: Elizabeth Lloyd   BREAST CANCER HISTORY: From the original intent nodes:  The patient had routine screening mammography 06/24/2011 suggesting a possible abnormality in her left breast. Additional views 07/01/2011 showed no evidence of malignancy. There was no palpable abnormality. There was mild nodularity in the lateral quadrants of the left breast, and six-month followup was suggested. This was performed 01/20/2012, and showed stability. On 06/27/2012 the patient had bilateral diagnostic mammography, and this time the 9 mm area of focal asymmetric density in the upper outer left breast appeared more irregular than prior. Ultrasound showed no associated abnormality, and the patient proceeded to stereotactic biopsy 06/29/2012. 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   The patient's subsequent history is as detailed below.  INTERVAL HISTORY: Elizabeth Lloyd returns today for followup of her left breast carcinoma. The interval history is unremarkable. She is tolerating anastrozole  Well with no side effects that she is aware of. Denies hot flashes or vaginal changes.   REVIEW OF SYSTEMS: The patient fell a few weeks ago after losing consciousness for a few seconds, but all XRAYs have been negative for fractures. She is currently wearing a left wrist brace, and complains of pain to this area, her left shoulder, and back. She has followed up with Dr. Inda Lloyd who has started her on NSAIDs which have helped. An EKG performed after the fall showed a "hesitation in electrical pulses" per the patient, and she will follow up with a cardiologist next month. She denies any other syncopal episodes, dizziness, or palpitations since the fall. A detailed review of systems was  otherwise entirely negative.   PAST MEDICAL HISTORY: Past Medical History  Diagnosis Date  . Breast cancer   . GERD (gastroesophageal reflux disease)   . Diabetes mellitus   . Hypertension   . Hyperlipidemia   . H/O colonoscopy 06/18/10  . H/O bone density study 2005  . Wears glasses   . Hernia   . Arthritis   . Full dentures     PAST SURGICAL HISTORY: Past Surgical History  Procedure Laterality Date  . Cholecystectomy    . Abdominal hysterectomy    . Hand surgery      bilateral  . Knee surgery      left  . Shoulder surgery      Bilateral  . Lt breas lumpectomy  07/24/12    FAMILY HISTORY Family History  Problem Relation Age of Onset  . Breast cancer Mother   . Colon cancer Brother    the patient's father died from heart disease at the age of 69. The patient's mother lived to be 49. The patient had 7 brothers and 2 sister. One brother had colon cancer, but she does not know at what age it was diagnosed. The patient's mother was diagnosed with colon cancer at the age of 3 and breast cancer at the same time. There is no other history of breast or ovarian cancer in the family.  GYNECOLOGIC HISTORY: Menarche age 63, "can't remember" when she went through the change of life. She did not take hormone replacement. She is GX P2, first live birth age 57.  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  Her daughter Elizabeth Lloyd is also living in [place]. The patient has 2 grandchildren age 3 and 42 as of July 2013. She is not a Ambulance person.   ADVANCED DIRECTIVES:  in place, separately scanned. She has named her daughter, Elizabeth Lloyd as her healthcare power of attorney. Elizabeth Lloyd can be reached at [phone]. The patient also completed and notarized a living will.  HEALTH MAINTENANCE: History  Substance Use Topics  . Smoking status: Never Smoker   . Smokeless tobacco: Never Used  . Alcohol  Use: Yes     Colonoscopy: 2011/ [facility]  PAP:  Bone density: 2015  Lipid panel:   No Known Allergies  Current Outpatient Prescriptions  Medication Sig Dispense Refill  . albuterol (PROVENTIL HFA;VENTOLIN HFA) 108 (90 BASE) MCG/ACT inhaler Inhale 2 puffs into the lungs every 6 (six) hours as needed.      Marland Kitchen anastrozole (ARIMIDEX) 1 MG tablet Take 1 tablet (1 mg total) by mouth daily.  90 tablet  3  . aspirin 81 MG tablet Take 81 mg by mouth daily.      Marland Kitchen atorvastatin (LIPITOR) 80 MG tablet Take 80 mg by mouth daily.       . calcium carbonate (OS-CAL) 600 MG TABS Take 600 mg by mouth 2 (two) times daily with a meal.      . docusate sodium (COLACE) 100 MG capsule Take 100 mg by mouth 4 (four) times daily.      . ergocalciferol (VITAMIN D2) 50000 UNITS capsule Take 50,000 Units by mouth once a week.      . fish oil-omega-3 fatty acids 1000 MG capsule Take 1,000 g by mouth daily.      Marland Kitchen losartan-hydrochlorothiazide (HYZAAR) 50-12.5 MG per tablet Take 1 tablet by mouth daily.      Marland Kitchen omeprazole (PRILOSEC) 40 MG capsule Take 40 mg by mouth daily.      . Plant Sterols and Stanols (CHOLEST OFF PO) Take 1 tablet by mouth daily.      . Probiotic Product (ALIGN PO) Take 1 tablet by mouth daily.      . Psyllium 48.57 % POWD Take 10 mLs by mouth daily.       No current facility-administered medications for this visit.    Filed Vitals:   07/14/14 1451  BP: 151/76  Pulse: 57  Temp: 98.2 F (36.8 C)  Resp: 18      Body mass index is 40.12 kg/(m^2).    ECOG FS: 0 Filed Weights   07/14/14 1451  Weight: 219 lb 6.4 oz (99.519 kg)   Middle-aged white woman who appears stated age  Sclerae unicteric, pupils equal and round Oropharynx clear and moist-- no thrush or other lesions No cervical or supraclavicular adenopathy Lungs no rales or rhonchi Heart regular rate and rhythm Abd soft, nontender, positive bowel sounds MSK no focal spinal tenderness, no upper extremity lymphedema Neuro: nonfocal,  well oriented, appropriate affect Breasts: The right breast is unremarkable. The left breast is status post lumpectomy. There is no evidence of recurrence. The left axilla is benign.   LAB RESULTS: Lab Results  Component Value Date   WBC 9.6 07/14/2014   NEUTROABS 6.2 07/14/2014   HGB 14.3 07/14/2014   HCT 43.3 07/14/2014   MCV 86.1 07/14/2014   PLT 213 07/14/2014      Chemistry      Component Value Date/Time   NA 140 01/13/2014 1411   NA 132* 07/23/2012 0911   K 3.8 01/13/2014 1411   K 3.6  07/23/2012 0911   CL 101 03/15/2013 1445   CL 96 07/23/2012 0911   CO2 30* 01/13/2014 1411   CO2 27 07/23/2012 0911   BUN 10.9 01/13/2014 1411   BUN 8 07/23/2012 0911   CREATININE 0.8 01/13/2014 1411   CREATININE 0.68 07/23/2012 0911      Component Value Date/Time   CALCIUM 9.6 01/13/2014 1411   CALCIUM 9.4 07/23/2012 0911   ALKPHOS 101 01/13/2014 1411   ALKPHOS 108 07/11/2012 1301   AST 21 01/13/2014 1411   AST 19 07/11/2012 1301   ALT 24 01/13/2014 1411   ALT 20 07/11/2012 1301   BILITOT 0.51 01/13/2014 1411   BILITOT 0.6 07/11/2012 1301       Lab Results  Component Value Date   LABCA2 31 01/24/2013    STUDIES:  [facility], showed osteoporosis with a t-score of -2.7  [facility] was unremarkable.    ASSESSMENT: 73 y.o. [place] woman,   (1)  status post left lumpectomy and sentinel lymph node biopsy 07/24/2012 for a pT1c pN0, stage IA invasive ductal carcinoma, grade 1, strongly estrogen and progesterone receptor positive, with an MIB-1 of 13, and no HER-2 amplification.  (2)  started on anastrozole in September 2013  (3)  Osteoporosis; received one dose of zoledronic acid in late March 2014, with very poor tolerance, (severe bone pain). This was subsequently discontinued.   PLAN:  Ashlon is doing well, now 2 years out from definitive surgery. She is tolerating the anastrozole well, obtaining it at a good  price, and wishes to continue treatment.   She will be due for a repeat DEXA scan in early Dec. We will then discuss how to address the osteoporosis when she returns to see Dr. Jana Hakim in January 2016.  She has been informed to call with any problems that may develop before her next visit here.  Marcelino Duster    07/14/2014    ADDENDUM: The patient is doing well on anastrozole except for the concern regarding osteoporosis. If a repeat bone scan is stable in December we will likely continue on anastrozole. However if there has been further bone loss we will consider switching to tamoxifen or adding denosumab to her treatment.  I personally saw this patient and performed a substantive portion of this encounter with the listed APP documented above.   Chauncey Cruel, MD

## 2014-07-14 NOTE — Telephone Encounter (Signed)
per pof to sch appt & BD-sch BD @ [facility]-adv pt of date of time-gave pt copy of sch

## 2014-07-25 ENCOUNTER — Other Ambulatory Visit: Payer: Self-pay | Admitting: *Deleted

## 2014-07-25 ENCOUNTER — Encounter: Payer: Self-pay | Admitting: Nurse Practitioner

## 2014-07-26 ENCOUNTER — Encounter: Payer: Self-pay | Admitting: Nurse Practitioner

## 2014-09-20 ENCOUNTER — Encounter: Payer: Self-pay | Admitting: Nurse Practitioner

## 2014-09-22 ENCOUNTER — Encounter: Payer: Self-pay | Admitting: *Deleted

## 2014-10-02 ENCOUNTER — Encounter: Payer: Self-pay | Admitting: *Deleted

## 2014-11-18 ENCOUNTER — Ambulatory Visit
Admission: RE | Admit: 2014-11-18 | Discharge: 2014-11-18 | Disposition: A | Discharge status: Home / Self Care | Payer: Medicare Other | Source: Ambulatory Visit | Attending: Oncology | Admitting: Oncology

## 2014-11-18 DIAGNOSIS — C50419 Malignant neoplasm of upper-outer quadrant of unspecified female breast: Secondary | ICD-10-CM

## 2014-12-17 ENCOUNTER — Telehealth: Payer: Self-pay | Admitting: Oncology

## 2014-12-17 NOTE — Telephone Encounter (Signed)
S/w pt advised appt chg from 1/28 (md pal) to 3/29 @ 11.30am. Pt verbalized understanding.

## 2015-01-15 ENCOUNTER — Ambulatory Visit: Payer: Medicare Other | Admitting: Oncology

## 2015-01-15 ENCOUNTER — Other Ambulatory Visit: Payer: Medicare Other

## 2015-01-25 ENCOUNTER — Other Ambulatory Visit: Payer: Self-pay | Admitting: Oncology

## 2015-01-26 ENCOUNTER — Other Ambulatory Visit: Payer: Self-pay | Admitting: Nurse Practitioner

## 2015-03-17 ENCOUNTER — Telehealth: Payer: Self-pay | Admitting: Oncology

## 2015-03-17 ENCOUNTER — Other Ambulatory Visit (HOSPITAL_BASED_OUTPATIENT_CLINIC_OR_DEPARTMENT_OTHER): Payer: Medicare Other

## 2015-03-17 ENCOUNTER — Ambulatory Visit (HOSPITAL_BASED_OUTPATIENT_CLINIC_OR_DEPARTMENT_OTHER): Payer: Medicare Other | Admitting: Oncology

## 2015-03-17 VITALS — BP 124/62 | HR 58 | Temp 98.5°F | Resp 18 | Ht 62.0 in | Wt 215.6 lb

## 2015-03-17 DIAGNOSIS — Z170 Estrogen receptor positive status [ER+]: Secondary | ICD-10-CM

## 2015-03-17 DIAGNOSIS — C50412 Malignant neoplasm of upper-outer quadrant of left female breast: Secondary | ICD-10-CM | POA: Diagnosis not present

## 2015-03-17 DIAGNOSIS — M810 Age-related osteoporosis without current pathological fracture: Secondary | ICD-10-CM

## 2015-03-17 DIAGNOSIS — C50419 Malignant neoplasm of upper-outer quadrant of unspecified female breast: Secondary | ICD-10-CM

## 2015-03-17 LAB — COMPREHENSIVE METABOLIC PANEL (CC13)
ALBUMIN: 3.8 g/dL (ref 3.5–5.0)
ALT: 28 U/L (ref 0–55)
ANION GAP: 9 meq/L (ref 3–11)
AST: 26 U/L (ref 5–34)
Alkaline Phosphatase: 99 U/L (ref 40–150)
BILIRUBIN TOTAL: 0.65 mg/dL (ref 0.20–1.20)
BUN: 10 mg/dL (ref 7.0–26.0)
CO2: 28 meq/L (ref 22–29)
Calcium: 9.7 mg/dL (ref 8.4–10.4)
Chloride: 101 mEq/L (ref 98–109)
Creatinine: 0.7 mg/dL (ref 0.6–1.1)
EGFR: 87 mL/min/{1.73_m2} — ABNORMAL LOW (ref >90)
Glucose: 113 mg/dl (ref 70–140)
Potassium: 3.9 mEq/L (ref 3.5–5.1)
SODIUM: 139 meq/L (ref 136–145)
TOTAL PROTEIN: 7.2 g/dL (ref 6.4–8.3)

## 2015-03-17 LAB — CBC WITH DIFFERENTIAL/PLATELET
BASO%: 0.3 % (ref 0.0–2.0)
Basophils Absolute: 0 10*3/uL (ref 0.0–0.1)
EOS ABS: 0.2 10*3/uL (ref 0.0–0.5)
EOS%: 2.2 % (ref 0.0–7.0)
HEMATOCRIT: 42.3 % (ref 34.8–46.6)
HGB: 14.4 g/dL (ref 11.6–15.9)
LYMPH%: 24.1 % (ref 14.0–49.7)
MCH: 29.4 pg (ref 25.1–34.0)
MCHC: 34 g/dL (ref 31.5–36.0)
MCV: 86.3 fL (ref 79.5–101.0)
MONO#: 0.5 10*3/uL (ref 0.1–0.9)
MONO%: 6.9 % (ref 0.0–14.0)
NEUT%: 66.5 % (ref 38.4–76.8)
NEUTROS ABS: 5.3 10*3/uL (ref 1.5–6.5)
PLATELETS: 214 10*3/uL (ref 145–400)
RBC: 4.9 10*6/uL (ref 3.70–5.45)
RDW: 12.9 % (ref 11.2–14.5)
WBC: 7.9 10*3/uL (ref 3.9–10.3)
lymph#: 1.9 10*3/uL (ref 0.9–3.3)

## 2015-03-17 MED ORDER — ALENDRONATE SODIUM 70 MG PO TABS: 70.0000 mg | ORAL_TABLET | ORAL | Status: DC

## 2015-03-17 NOTE — Progress Notes (Signed)
ID: Calvert Cantor   DOB: 1941-03-28  MR#: [id number]  UEK#:[id number]  PCP: Marjorie Smolder, MD GYN: SU:  OTHER MD: Jamie Kato  CHIEF COMPLAINT: Estrogen receptor positive breast cancer  CURRENT TREATMENT: Anastrozole   BREAST CANCER HISTORY: From the original intent nodes:  The patient had routine screening mammography 06/24/2011 suggesting a possible abnormality in her left breast. Additional views 07/01/2011 showed no evidence of malignancy. There was no palpable abnormality. There was mild nodularity in the lateral quadrants of the left breast, and six-month followup was suggested. This was performed 01/20/2012, and showed stability. On 06/27/2012 the patient had bilateral diagnostic mammography, and this time the 9 mm area of focal asymmetric density in the upper outer left breast appeared more irregular than prior. Ultrasound showed no associated abnormality, and the patient proceeded to stereotactic biopsy 06/29/2012. 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   The patient's subsequent history is as detailed below.  INTERVAL HISTORY: Makylee returns today for followup of her left breast carcinoma. The interval history significant for her awaiting a second great-grandchild. As far as breast cancer is concerned she continues on anastrozole. She has no hot flashes, vaginal dryness, or arthralgias/myalgias from that medication. She obtains it at a good price. She also had a bone density obtained in December which shows some improvement in her hip bone, possibly from the Zometa she received, but continuing osteoporosis in the humerus.    REVIEW OF SYSTEMS: She needed some glasses. She has a full set of upper and lower teeth. She is short of breath when walking up stairs but not with normal walking or normal activity. She has mild heartburn problems. She has  aches and pains here in there which are not more intense or persistent than before. A detailed review of systems today was otherwise noncontributory  PAST MEDICAL HISTORY: Past Medical History  Diagnosis Date  . Breast cancer   . GERD (gastroesophageal reflux disease)   . Diabetes mellitus   . Hypertension   . Hyperlipidemia   . H/O colonoscopy 06/18/10  . H/O bone density study 2005  . Wears glasses   . Hernia   . Arthritis   . Full dentures     PAST SURGICAL HISTORY: Past Surgical History  Procedure Laterality Date  . Cholecystectomy    . Abdominal hysterectomy    . Hand surgery      bilateral  . Knee surgery      left  . Shoulder surgery      Bilateral  . Lt breas lumpectomy  07/24/12    FAMILY HISTORY Family History  Problem Relation Age of Onset  . Breast cancer Mother   . Colon cancer Brother    the patient's father died from heart disease at the age of 67. The patient's mother lived to be 26. The patient had 7 brothers and 2 sister. One brother had colon cancer, but she does not know at what age it was diagnosed. The patient's mother was diagnosed with colon cancer at the age of 36 and breast cancer at the same time. There is no other history of breast or ovarian cancer in the family.  GYNECOLOGIC HISTORY: Menarche age 36, "can't remember" when she went through the change of life. She did not take hormone replacement. She is GX P2, first live birth age 2.  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  She is widowed, and lives with her daughter Nonnie Done in [place].  Her daughter Renee Harder is also living in [place]. The patient has 2 grandchildren age 39 and 87 as of July 2013. She is not a Ambulance person.   ADVANCED DIRECTIVES:  in place, separately scanned. She has named her daughter, Nonnie Done as her healthcare power of attorney. Marlowe Kays can be reached at [phone]. The patient also completed and notarized a living will.  HEALTH  MAINTENANCE: History  Substance Use Topics  . Smoking status: Never Smoker   . Smokeless tobacco: Never Used  . Alcohol Use: Yes     Colonoscopy: 2011/ [facility]  PAP:  Bone density: 2015  Lipid panel:   No Known Allergies  Current Outpatient Prescriptions  Medication Sig Dispense Refill  . albuterol (PROVENTIL HFA;VENTOLIN HFA) 108 (90 BASE) MCG/ACT inhaler Inhale 2 puffs into the lungs every 6 (six) hours as needed.    Marland Kitchen anastrozole (ARIMIDEX) 1 MG tablet TAKE ONE TABLET BY MOUTH ONCE DAILY 90 tablet 0  . aspirin 81 MG tablet Take 81 mg by mouth daily.    Marland Kitchen atorvastatin (LIPITOR) 80 MG tablet Take 80 mg by mouth daily.     . calcium carbonate (OS-CAL) 600 MG TABS Take 600 mg by mouth 2 (two) times daily with a meal.    . Cranberry 500 MG CAPS Take 500 mg by mouth. Pt takes 1 tablet every other day.    . docusate sodium (COLACE) 100 MG capsule Take 100 mg by mouth 4 (four) times daily.    . ergocalciferol (VITAMIN D2) 50000 UNITS capsule Take 50,000 Units by mouth once a week.    . fish oil-omega-3 fatty acids 1000 MG capsule Take 1,000 g by mouth daily.    . Glucosamine-Chondroit-Vit C-Mn (GLUCOSAMINE CHONDR 1500 COMPLX PO) Take 3,000 mg by mouth daily.    Marland Kitchen losartan-hydrochlorothiazide (HYZAAR) 50-12.5 MG per tablet Take 1 tablet by mouth daily.    . naproxen (NAPROSYN) 500 MG tablet     . omeprazole (PRILOSEC) 40 MG capsule Take 40 mg by mouth daily.    . Plant Sterols and Stanols (CHOLEST OFF PO) Take 1 tablet by mouth daily.    . polyethylene glycol (MIRALAX / GLYCOLAX) packet Take 17 g by mouth daily.    . Probiotic Product (ALIGN PO) Take 1 tablet by mouth daily.    . Psyllium 48.57 % POWD Take 10 mLs by mouth daily.     No current facility-administered medications for this visit.   Objective: Older white woman in no acute distress Filed Vitals:   03/17/15 1125  BP: 124/62  Pulse: 58  Temp: 98.5 F (36.9 C)  Resp: 18      Body mass index is 39.42 kg/(m^2).    ECOG FS:  1 Filed Weights   03/17/15 1125  Weight: 215 lb 9.6 oz (97.796 kg)    Sclerae unicteric, EOMs intact Oropharynx clear, full upper and lower plates No cervical or supraclavicular adenopathy Lungs no rales or rhonchi Heart regular rate and rhythm Abd soft, obese, nontender, positive bowel sounds MSK no focal spinal tenderness, no upper extremity lymphedema Neuro: nonfocal, well oriented, positive affect Breasts: The right breast is unremarkable. Breast is status post lumpectomy. There is no evidence of local recurrence. The left axilla is benign.  LAB RESULTS: Lab Results  Component Value Date   WBC 7.9 03/17/2015   NEUTROABS 5.3 03/17/2015   HGB 14.4 03/17/2015   HCT 42.3 03/17/2015   MCV  86.3 03/17/2015   PLT 214 03/17/2015      Chemistry      Component Value Date/Time   NA 139 03/17/2015 1046   NA 132* 07/23/2012 0911   K 3.9 03/17/2015 1046   K 3.6 07/23/2012 0911   CL 101 03/15/2013 1445   CL 96 07/23/2012 0911   CO2 28 03/17/2015 1046   CO2 27 07/23/2012 0911   BUN 10.0 03/17/2015 1046   BUN 8 07/23/2012 0911   CREATININE 0.7 03/17/2015 1046   CREATININE 0.68 07/23/2012 0911      Component Value Date/Time   CALCIUM 9.7 03/17/2015 1046   CALCIUM 9.4 07/23/2012 0911   ALKPHOS 99 03/17/2015 1046   ALKPHOS 108 07/11/2012 1301   AST 26 03/17/2015 1046   AST 19 07/11/2012 1301   ALT 28 03/17/2015 1046   ALT 20 07/11/2012 1301   BILITOT 0.65 03/17/2015 1046   BILITOT 0.6 07/11/2012 1301       Lab Results  Component Value Date   LABCA2 31 01/24/2013    STUDIES:  [facility], showed osteoporosis with a t-score of -2.7  [facility]2014 was unremarkable. CLINICAL DATA: Postmenopausal osteoporosis screening. Patient is on anastrozole.  EXAM: DUAL X-RAY ABSORPTIOMETRY (DXA) FOR BONE MINERAL DENSITY  FINDINGS: AP LUMBAR SPINE not used for calculation due to  significant degenerative changes.  Left FOREARM (1/3 RADIUS)  Bone Mineral Density (BMD): 0.506  Young Adult T Score: -3.1  Z Score: -0.8  Left FEMUR neck  Bone Mineral Density (BMD): 0.757 g/cm2  Young Adult T-Score: -0.8  Z-Score: 1.1  ASSESSMENT: Patient's diagnostic category is OSTEOPOROSIS by WHO Criteria.  FRACTURE RISK: INCREASED.  FRAX: Not calculated due to T-score at or below -2.5.  COMPARISON: 4.4% increase in bone density over the total left hip compared to 11/02/2012 which is statistically significant. 4.8% decrease in bone density over the left forearm compared to 2013 which is statistically significant   ASSESSMENT: 74 y.o. [place] woman,   (1)  status post left lumpectomy and sentinel lymph node biopsy 07/24/2012 for a pT1c pN0, stage IA invasive ductal carcinoma, grade 1, strongly estrogen and progesterone receptor positive, with an MIB-1 of 13, and no HER-2 amplification.  (2)  started anastrozole in September 2013  (3)  Osteoporosis; received one dose of zoledronic acid in late March 2014, with very poor tolerance, (severe bone pain). This was subsequently discontinued.  (a) alendronate started April 2016   PLAN:  Wonder is tolerating the anastrozole well. She really does not want to change to tamoxifen, which is one option given her problems with bone density.  We could try Zometa again but she likely would have the same symptoms as before and she would prefer not to do that. We could consider denosumab but again that might produce the same intense bony response  I think she might be better off with Fosamax. We discussed this in detail and she has a good understanding of the possible toxicities, side effects and complications. I instructed her and how to take the medication and gave her the instructions in writing. I went ahead and placed the prescription for her. If she has any problems of course she will call us.  Otherwise  the plan will be to continue anastrozole to a total of 5 years. Where going to see her again in September and that will be a three-year mild post for her. We will start seeing her  on a once a year basis for the next 2 years. Her graduation date then will be September 2018  Jiyah has a good understanding of this plan. She agrees with it. She knows the goal of treatment in her case is cure. She will call with any problems that may develop before her next visit here. MAGRINAT,GUSTAV C    03/17/2015

## 2015-03-17 NOTE — Telephone Encounter (Signed)
Appointments made and avs printed for patient °

## 2015-04-03 ENCOUNTER — Other Ambulatory Visit: Payer: Self-pay | Admitting: Oncology

## 2015-06-04 ENCOUNTER — Other Ambulatory Visit: Payer: Self-pay | Admitting: Oncology

## 2015-06-05 NOTE — Telephone Encounter (Signed)
Last ov 03/17/15.  Next ov 09/18/15. Chart reviewed

## 2015-06-07 IMAGING — CR DG LUMBAR SPINE 2-3V
3 series · 3 of 3 positions shown · non-contrast
Comparison: None.

CLINICAL DATA: Lower back pain post fall 2 weeks ago

EXAM:
LUMBAR SPINE - 2-3 VIEW

[t l-spine a.p.]
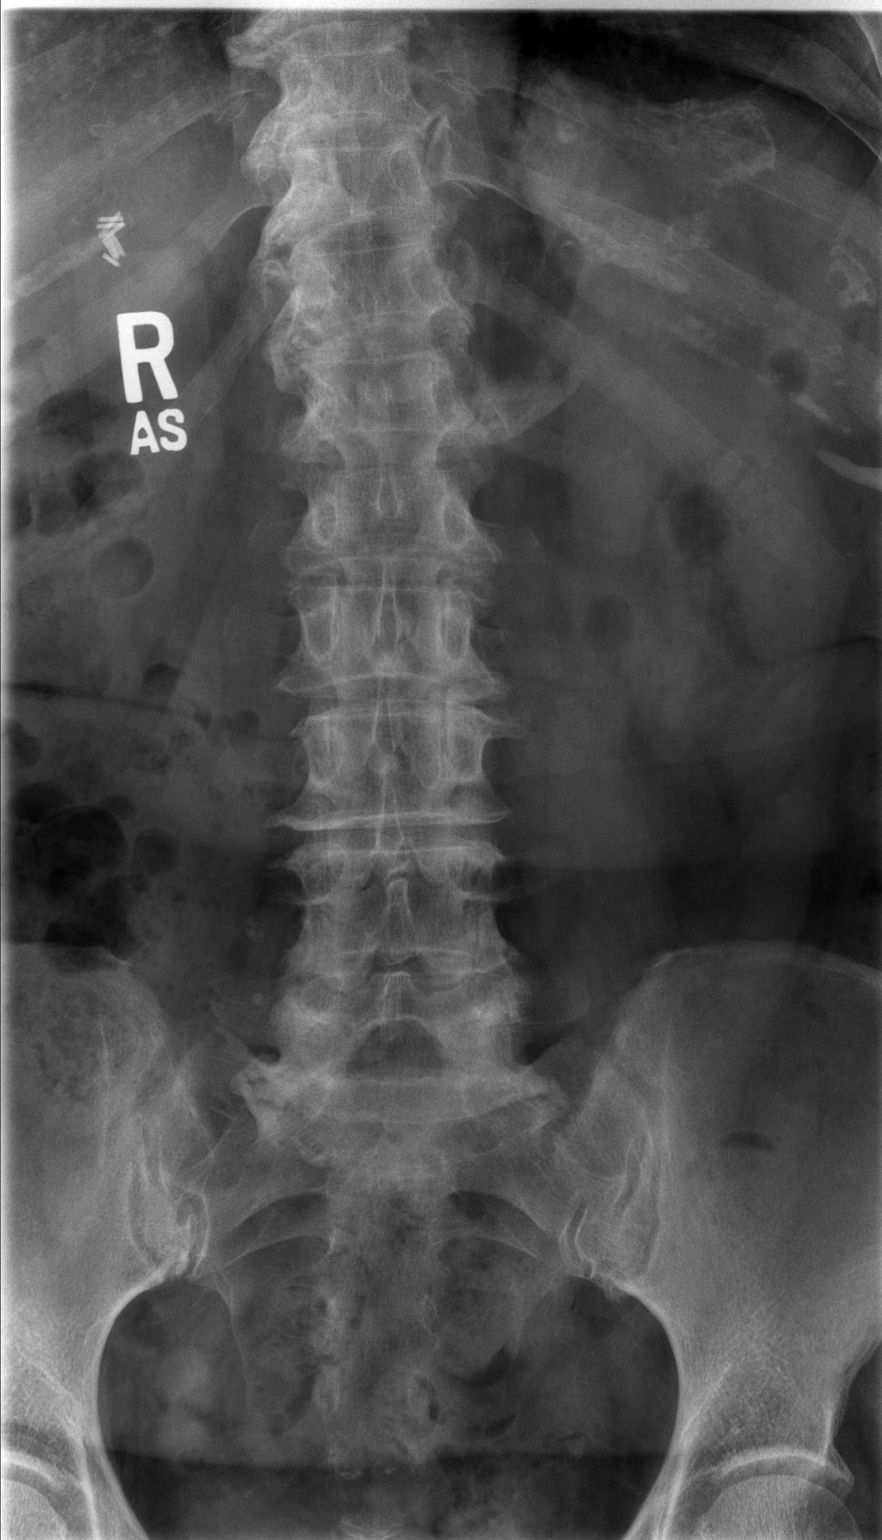

[t l-spine lat]
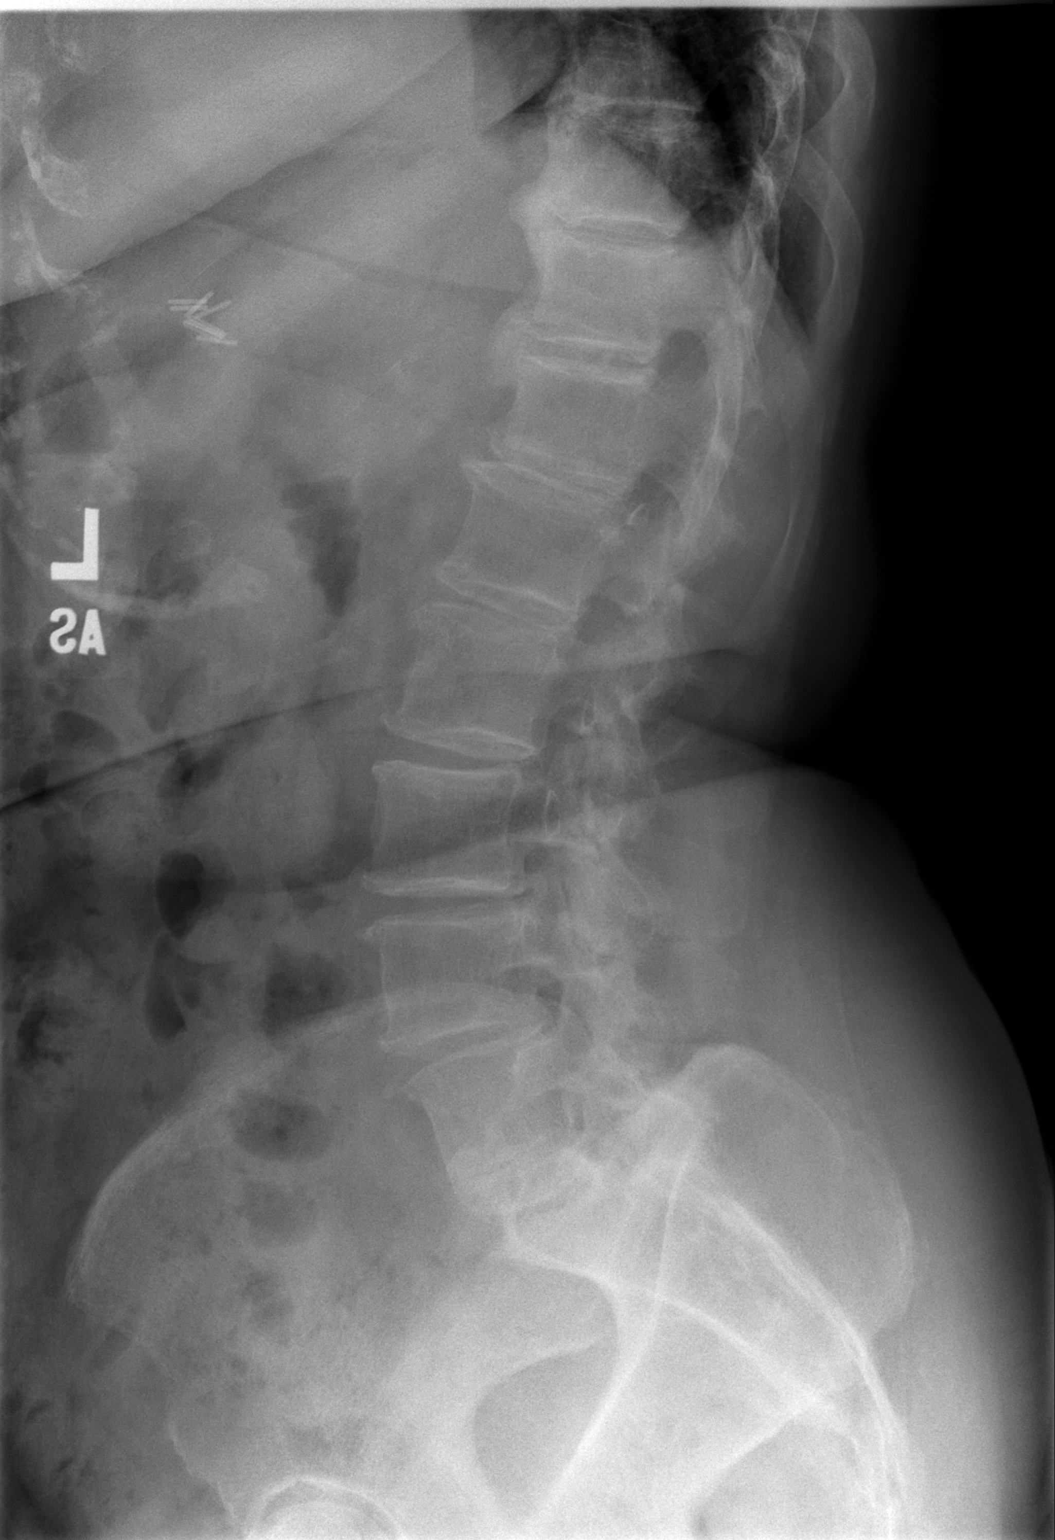

[t l-spine l5-s1 spot]
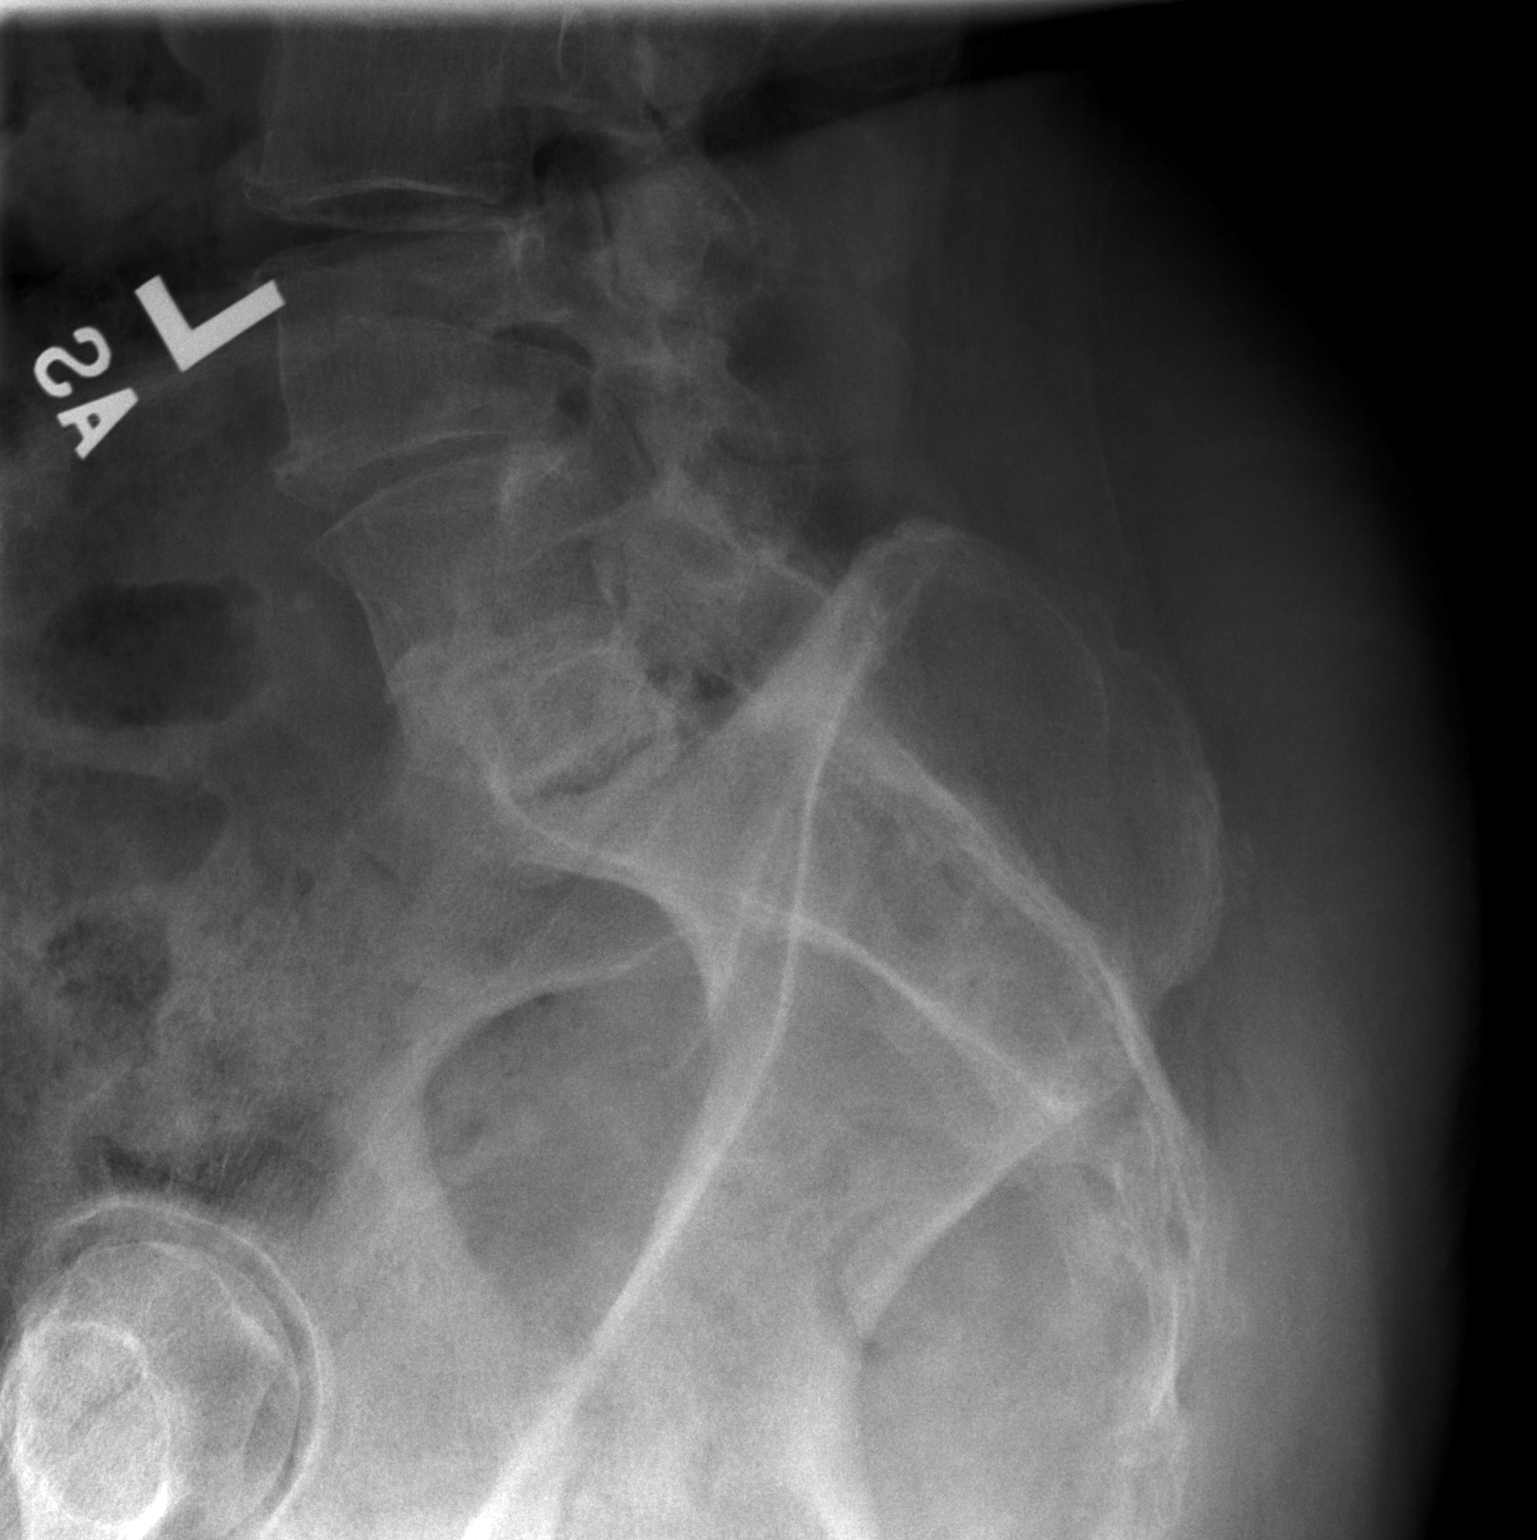

[3 of 3 positions shown; findings below may reference images not displayed]

FINDINGS: Three views of the lumbar spine submitted. Multilevel anterior
spurring noted thoracic and lumbar spine. No acute fracture or
subluxation. Disc space flattening with mild anterior spurring
multilevel. Moderate disc space flattening with anterior spurring at
L4-L5 and L5-S1 level. Facet degenerative changes L3-L4 and L5
level. Degenerative changes lower thoracic spine.
IMPRESSION: No acute fracture or subluxation. Multilevel degenerative changes as
described above.

## 2015-06-07 IMAGING — CR DG HIP (WITH OR WITHOUT PELVIS) 2-3V*L*
2 series · 2 of 2 positions shown · non-contrast
Comparison: None.

CLINICAL DATA: Left hip pain.  Fall 2 weeks ago.

EXAM:
LEFT HIP - COMPLETE 2+ VIEW

[t hip ap left]
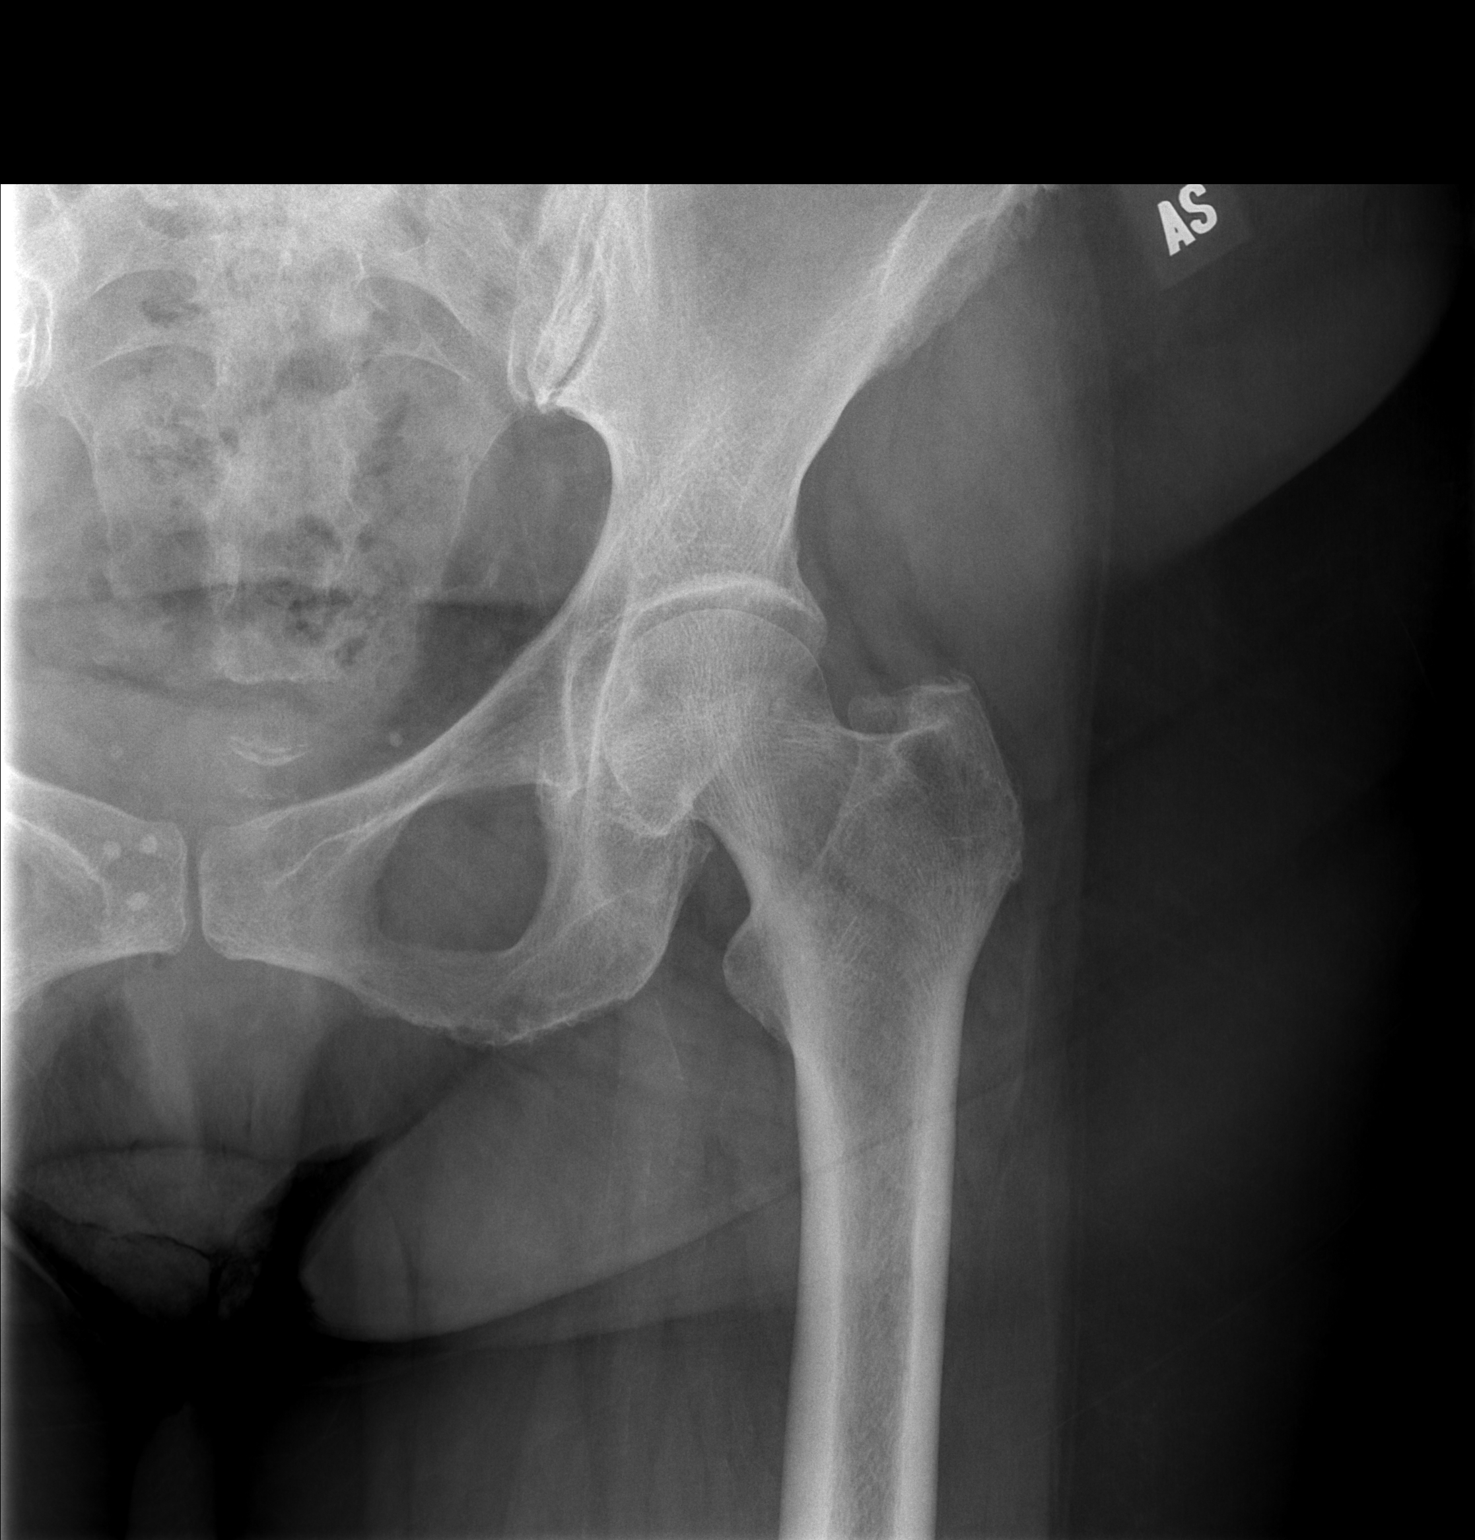

[t hip frog leg left]
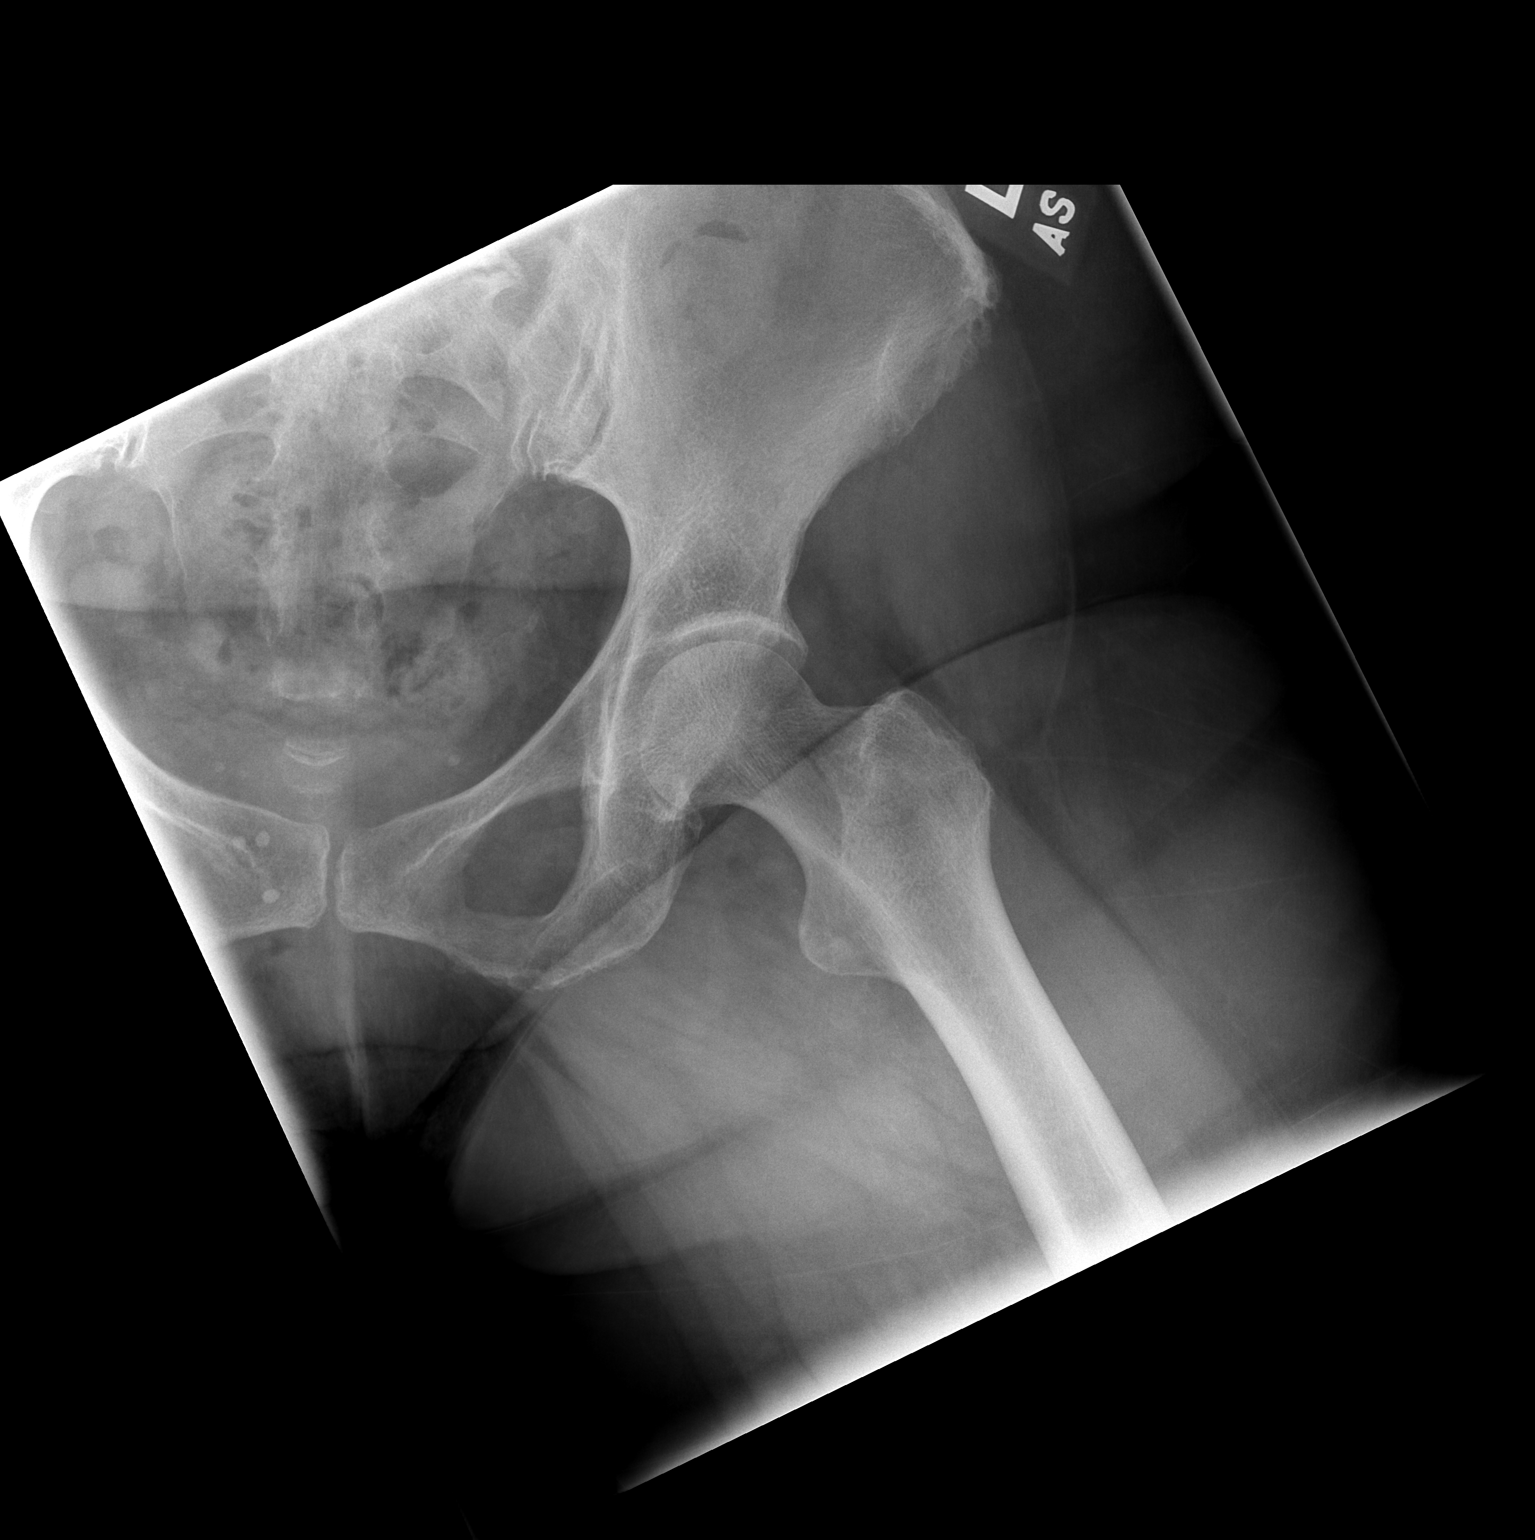

[2 of 2 positions shown; findings below may reference images not displayed]

FINDINGS: The left hip is located. Negative for a fracture. Phleboliths in the
pelvis. The visualized pelvic bony ring is intact.
IMPRESSION: No acute bone abnormality.

## 2015-06-26 ENCOUNTER — Other Ambulatory Visit: Payer: Self-pay | Admitting: Family Medicine

## 2015-06-26 DIAGNOSIS — Z853 Personal history of malignant neoplasm of breast: Secondary | ICD-10-CM

## 2015-06-26 DIAGNOSIS — Z9889 Other specified postprocedural states: Secondary | ICD-10-CM

## 2015-07-17 ENCOUNTER — Ambulatory Visit
Admission: RE | Admit: 2015-07-17 | Discharge: 2015-07-17 | Disposition: A | Discharge status: Home / Self Care | Payer: Medicare Other | Source: Ambulatory Visit | Attending: Family Medicine | Admitting: Family Medicine

## 2015-07-17 DIAGNOSIS — Z9889 Other specified postprocedural states: Secondary | ICD-10-CM

## 2015-07-17 DIAGNOSIS — Z853 Personal history of malignant neoplasm of breast: Secondary | ICD-10-CM

## 2015-09-17 ENCOUNTER — Other Ambulatory Visit: Payer: Self-pay | Admitting: *Deleted

## 2015-09-17 DIAGNOSIS — C50412 Malignant neoplasm of upper-outer quadrant of left female breast: Secondary | ICD-10-CM

## 2015-09-18 ENCOUNTER — Encounter: Payer: Self-pay | Admitting: Nurse Practitioner

## 2015-09-18 ENCOUNTER — Ambulatory Visit (HOSPITAL_BASED_OUTPATIENT_CLINIC_OR_DEPARTMENT_OTHER): Payer: Medicare Other | Admitting: Nurse Practitioner

## 2015-09-18 ENCOUNTER — Other Ambulatory Visit (HOSPITAL_BASED_OUTPATIENT_CLINIC_OR_DEPARTMENT_OTHER): Payer: Medicare Other

## 2015-09-18 ENCOUNTER — Telehealth: Payer: Self-pay | Admitting: Nurse Practitioner

## 2015-09-18 VITALS — BP 134/61 | HR 60 | Temp 98.1°F | Resp 18 | Ht 62.0 in | Wt 223.1 lb

## 2015-09-18 DIAGNOSIS — Z79811 Long term (current) use of aromatase inhibitors: Secondary | ICD-10-CM | POA: Diagnosis not present

## 2015-09-18 DIAGNOSIS — C50412 Malignant neoplasm of upper-outer quadrant of left female breast: Secondary | ICD-10-CM

## 2015-09-18 DIAGNOSIS — M810 Age-related osteoporosis without current pathological fracture: Secondary | ICD-10-CM | POA: Diagnosis not present

## 2015-09-18 DIAGNOSIS — Z170 Estrogen receptor positive status [ER+]: Secondary | ICD-10-CM | POA: Diagnosis not present

## 2015-09-18 LAB — COMPREHENSIVE METABOLIC PANEL (CC13)
ALT: 24 U/L (ref 0–55)
AST: 23 U/L (ref 5–34)
Albumin: 3.7 g/dL (ref 3.5–5.0)
Alkaline Phosphatase: 102 U/L (ref 40–150)
Anion Gap: 8 mEq/L (ref 3–11)
BILIRUBIN TOTAL: 0.45 mg/dL (ref 0.20–1.20)
BUN: 12.8 mg/dL (ref 7.0–26.0)
CO2: 27 meq/L (ref 22–29)
CREATININE: 0.8 mg/dL (ref 0.6–1.1)
Calcium: 9.1 mg/dL (ref 8.4–10.4)
Chloride: 101 mEq/L (ref 98–109)
EGFR: 75 mL/min/{1.73_m2} — ABNORMAL LOW (ref >90)
GLUCOSE: 144 mg/dL — AB (ref 70–140)
Potassium: 3.8 mEq/L (ref 3.5–5.1)
SODIUM: 136 meq/L (ref 136–145)
Total Protein: 7.2 g/dL (ref 6.4–8.3)

## 2015-09-18 LAB — CBC WITH DIFFERENTIAL/PLATELET
BASO%: 1.4 % (ref 0.0–2.0)
Basophils Absolute: 0.1 10*3/uL (ref 0.0–0.1)
EOS%: 1.8 % (ref 0.0–7.0)
Eosinophils Absolute: 0.1 10*3/uL (ref 0.0–0.5)
HCT: 42.4 % (ref 34.8–46.6)
HGB: 14.3 g/dL (ref 11.6–15.9)
LYMPH%: 25.1 % (ref 14.0–49.7)
MCH: 28.7 pg (ref 25.1–34.0)
MCHC: 33.6 g/dL (ref 31.5–36.0)
MCV: 85.4 fL (ref 79.5–101.0)
MONO#: 0.5 10*3/uL (ref 0.1–0.9)
MONO%: 6.1 % (ref 0.0–14.0)
NEUT#: 5.4 10*3/uL (ref 1.5–6.5)
NEUT%: 65.6 % (ref 38.4–76.8)
Platelets: 212 10*3/uL (ref 145–400)
RBC: 4.97 10*6/uL (ref 3.70–5.45)
RDW: 13.2 % (ref 11.2–14.5)
WBC: 8.2 10*3/uL (ref 3.9–10.3)
lymph#: 2.1 10*3/uL (ref 0.9–3.3)

## 2015-09-18 NOTE — Progress Notes (Signed)
ID: Calvert Cantor   DOB: 08-30-1941  MR#: [id number]  FGH#:[id number]  PCP: Marjorie Smolder, MD GYN: SU:  OTHER MD: Jamie Kato  CHIEF COMPLAINT: Estrogen receptor positive breast cancer  CURRENT TREATMENT: Anastrozole  BREAST CANCER HISTORY: From the original intent nodes:  The patient had routine screening mammography 06/24/2011 suggesting a possible abnormality in her left breast. Additional views 07/01/2011 showed no evidence of malignancy. There was no palpable abnormality. There was mild nodularity in the lateral quadrants of the left breast, and six-month followup was suggested. This was performed 01/20/2012, and showed stability. On 06/27/2012 the patient had bilateral diagnostic mammography, and this time the 9 mm area of focal asymmetric density in the upper outer left breast appeared more irregular than prior. Ultrasound showed no associated abnormality, and the patient proceeded to stereotactic biopsy 06/29/2012. 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   The patient's subsequent history is as detailed below.  INTERVAL HISTORY: Kristeen returns today for follow up of her left breast carcinoma. She has been on anastrozole since September 2013. She tolerates this well with no complaints. She denies hot flashes, vaginal changes, or arthralgias/myalgias. She is also on fosamax weekly for her osteoporosis, and so far has had no complaints. She has no heartburn or reflux from this. The interval history is remarkable for a colonoscopy. She had a few polyps removed, but they were benign.   REVIEW OF SYSTEMS: Kriti denies fevers, chills, nausea, vomiting, or changes in bowel habits. She does have stress urinary incontinence frequently. She is eating and drinking well. She denies shortness of breath, chest pain, cough, or palpitations. Her only pain is to her  left shoulder that has a limited range of motion. She has had surgery to this area in the past. She denies headaches, dizziness, or vision changes. A detailed review of systems is otherwise stable.  PAST MEDICAL HISTORY: Past Medical History  Diagnosis Date  . Breast cancer   . GERD (gastroesophageal reflux disease)   . Diabetes mellitus   . Hypertension   . Hyperlipidemia   . H/O colonoscopy 06/18/10  . H/O bone density study 2005  . Wears glasses   . Hernia   . Arthritis   . Full dentures     PAST SURGICAL HISTORY: Past Surgical History  Procedure Laterality Date  . Cholecystectomy    . Abdominal hysterectomy    . Hand surgery      bilateral  . Knee surgery      left  . Shoulder surgery      Bilateral  . Lt breas lumpectomy  07/24/12    FAMILY HISTORY Family History  Problem Relation Age of Onset  . Breast cancer Mother   . Colon cancer Brother    the patient's father died from heart disease at the age of 67. The patient's mother lived to be 11. The patient had 7 brothers and 2 sister. One brother had colon cancer, but she does not know at what age it was diagnosed. The patient's mother was diagnosed with colon cancer at the age of 56 and breast cancer at the same time. There is no other history of breast or ovarian cancer in the family.  GYNECOLOGIC HISTORY: Menarche age 29, "can't remember" when she went through the change of life. She did not take hormone replacement. She is GX P2, first live birth age 9.  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  Her daughter Renee Harder is also living in [place]. The patient has 2 grandchildren age 50 and 64 as of July 2013. She is not a Ambulance person.   ADVANCED DIRECTIVES:  in place, separately scanned. She has named her daughter, Nonnie Done as her healthcare power of attorney. Marlowe Kays can be reached at [phone]. The patient also  completed and notarized a living will.  HEALTH MAINTENANCE: Social History  Substance Use Topics  . Smoking status: Never Smoker   . Smokeless tobacco: Never Used  . Alcohol Use: Yes     Colonoscopy: 2011/ [facility]  PAP:  Bone density: 2015  Lipid panel:   No Known Allergies  Current Outpatient Prescriptions  Medication Sig Dispense Refill  . alendronate (FOSAMAX) 70 MG tablet Take 1 tablet (70 mg total) by mouth once a week. Take with a full glass of water on an empty stomach. 15 tablet 4  . anastrozole (ARIMIDEX) 1 MG tablet TAKE ONE TABLET BY MOUTH ONCE DAILY 90 tablet 1  . aspirin 81 MG tablet Take 81 mg by mouth daily.    Marland Kitchen atorvastatin (LIPITOR) 80 MG tablet Take 80 mg by mouth daily.     . calcium carbonate (OS-CAL) 600 MG TABS Take 600 mg by mouth 2 (two) times daily with a meal.    . Cranberry 500 MG CAPS Take 500 mg by mouth 2 (two) times daily.     Marland Kitchen docusate sodium (COLACE) 100 MG capsule Take 100 mg by mouth 4 (four) times daily.    . ergocalciferol (VITAMIN D2) 50000 UNITS capsule Take 50,000 Units by mouth once a week.    . fish oil-omega-3 fatty acids 1000 MG capsule Take 1,000 g by mouth daily.    . Glucosamine-Chondroit-Vit C-Mn (GLUCOSAMINE CHONDR 1500 COMPLX PO) Take 3,000 mg by mouth daily.    Marland Kitchen losartan-hydrochlorothiazide (HYZAAR) 50-12.5 MG per tablet Take 1 tablet by mouth daily.    Marland Kitchen omeprazole (PRILOSEC) 40 MG capsule Take 40 mg by mouth daily.    . Plant Sterols and Stanols (CHOLEST OFF PO) Take 1 tablet by mouth daily.    . polyethylene glycol (MIRALAX / GLYCOLAX) packet Take 17 g by mouth daily.    . Probiotic Product (ALIGN PO) Take 1 tablet by mouth daily.    . Psyllium 48.57 % POWD Take 10 mLs by mouth daily.    Marland Kitchen albuterol (PROVENTIL HFA;VENTOLIN HFA) 108 (90 BASE) MCG/ACT inhaler Inhale 2 puffs into the lungs every 6 (six) hours as needed.     No current facility-administered medications for this visit.   Objective: Older white woman in no acute  distress Filed Vitals:   09/18/15 1441  BP: 134/61  Pulse: 60  Temp: 98.1 F (36.7 C)  Resp: 18      Body mass index is 40.79 kg/(m^2).    ECOG FS: 1 Filed Weights   09/18/15 1441  Weight: 223 lb 1.6 oz (101.197 kg)   Skin: warm, dry  HEENT: sclerae anicteric, conjunctivae pink, oropharynx clear. No thrush or mucositis.  Lymph Nodes: No cervical or supraclavicular lymphadenopathy  Lungs: clear to auscultation bilaterally, no rales, wheezes, or rhonci  Heart: regular rate and rhythm  Abdomen: obese, soft, non tender, positive bowel sounds  Musculoskeletal: No focal spinal tenderness,  Neuro: non focal, well oriented, positive affect  Breasts: left breast status post lumpectomy. No evidence of recurrent disease. Left  axilla benign. Right breast unremarkable.   LAB RESULTS: Lab Results  Component Value Date   WBC 8.2 09/18/2015   NEUTROABS 5.4 09/18/2015   HGB 14.3 09/18/2015   HCT 42.4 09/18/2015   MCV 85.4 09/18/2015   PLT 212 09/18/2015      Chemistry      Component Value Date/Time   NA 136 09/18/2015 1415   NA 132* 07/23/2012 0911   K 3.8 09/18/2015 1415   K 3.6 07/23/2012 0911   CL 101 03/15/2013 1445   CL 96 07/23/2012 0911   CO2 27 09/18/2015 1415   CO2 27 07/23/2012 0911   BUN 12.8 09/18/2015 1415   BUN 8 07/23/2012 0911   CREATININE 0.8 09/18/2015 1415   CREATININE 0.68 07/23/2012 0911      Component Value Date/Time   CALCIUM 9.1 09/18/2015 1415   CALCIUM 9.4 07/23/2012 0911   ALKPHOS 102 09/18/2015 1415   ALKPHOS 108 07/11/2012 1301   AST 23 09/18/2015 1415   AST 19 07/11/2012 1301   ALT 24 09/18/2015 1415   ALT 20 07/11/2012 1301   BILITOT 0.45 09/18/2015 1415   BILITOT 0.6 07/11/2012 1301       Lab Results  Component Value Date   LABCA2 31 01/24/2013    STUDIES: No results found. EXAM: DIGITAL DIAGNOSTIC BILATERAL MAMMOGRAM WITH 3D TOMOSYNTHESIS AND CAD  COMPARISON: Previous exam(s).  ACR Breast Density Category b: There are  scattered areas of fibroglandular density.  FINDINGS: Post lumpectomy changes in the upper outer left breast. No findings suspicious for malignancy in either breast.  Mammographic images were processed with CAD.  IMPRESSION: No evidence of malignancy.  RECOMMENDATION: Bilateral diagnostic mammogram in 1 year.  I have discussed the findings and recommendations with the patient. Results were also provided in writing at the conclusion of the visit. If applicable, a reminder letter will be sent to the patient regarding the next appointment.  BI-RADS CATEGORY 2: Benign.   Electronically Signed  By: Claudie Revering M.D.  On: 07/17/2015 14:23  ASSESSMENT: 74 y.o. [place] woman,   (1)  status post left lumpectomy and sentinel lymph node biopsy 07/24/2012 for a pT1c pN0, stage IA invasive ductal carcinoma, grade 1, strongly estrogen and progesterone receptor positive, with an MIB-1 of 13, and no HER-2 amplification.  (2)  started anastrozole in September 2013  (3)  Osteoporosis; received one dose of zoledronic acid in late March 2014, with very poor tolerance, (severe bone pain). This was subsequently discontinued.  (a) alendronate started April 2016   PLAN:  [place] is doing well as far as her breast cancer is concerned. She is now 3 years out from her definitive surgery with no evidence of recurrent disease. The labs were reviewed in detai and were stable. Her most recent mammogram is benign. She is tolerating the anastrozole well and will continue this drug for 5 years of antiestrogen therapy.   Makhya will return in 1 year for labs and a follow up visit. She understands and agrees with this plan. She knows the goal of treatment in her case is cure. She has been encouraged to call with any issues that might arise before her next visit here.   Genelle Gather Boelter    09/18/2015

## 2015-09-18 NOTE — Telephone Encounter (Signed)
Appointments made and avs printed for patient °

## 2015-09-30 ENCOUNTER — Other Ambulatory Visit (HOSPITAL_COMMUNITY)
Admission: RE | Admit: 2015-09-30 | Discharge: 2015-09-30 | Disposition: A | Discharge status: Home / Self Care | Payer: Medicare Other | Source: Ambulatory Visit | Attending: Family Medicine | Admitting: Family Medicine

## 2015-09-30 ENCOUNTER — Other Ambulatory Visit: Payer: Self-pay | Admitting: Family Medicine

## 2015-09-30 DIAGNOSIS — Z01411 Encounter for gynecological examination (general) (routine) with abnormal findings: Secondary | ICD-10-CM | POA: Diagnosis present

## 2015-10-02 LAB — CYTOLOGY - PAP

## 2016-01-12 NOTE — H&P (Signed)
Elizabeth Lloyd is an 75 y.o. female.    Chief Complaint: left shoulder pain and weakness  HPI: Pt is a 75 y.o. female complaining of left shoulder pain for multiple years. Pain had continually increased since the beginning. X-rays in the clinic show end-stage arthritic changes of the left shoulder. Pt has tried various conservative treatments which have failed to alleviate their symptoms, including injections and therapy. Various options are discussed with the patient. Risks, benefits and expectations were discussed with the patient. Patient understand the risks, benefits and expectations and wishes to proceed with surgery.   PCP:  Marjorie Smolder, MD  D/C Plans: Home  PMH: Past Medical History  Diagnosis Date  . Breast cancer   . GERD (gastroesophageal reflux disease)   . Diabetes mellitus   . Hypertension   . Hyperlipidemia   . H/O colonoscopy 06/18/10  . H/O bone density study 2005  . Wears glasses   . Hernia   . Arthritis   . Full dentures     PSH: Past Surgical History  Procedure Laterality Date  . Cholecystectomy    . Abdominal hysterectomy    . Hand surgery      bilateral  . Knee surgery      left  . Shoulder surgery      Bilateral  . Lt breas lumpectomy  07/24/12    Social History:  reports that she has never smoked. She has never used smokeless tobacco. She reports that she drinks alcohol. She reports that she does not use illicit drugs.  Allergies:  No Known Allergies  Medications: No current facility-administered medications for this encounter.   Current Outpatient Prescriptions  Medication Sig Dispense Refill  . albuterol (PROVENTIL HFA;VENTOLIN HFA) 108 (90 BASE) MCG/ACT inhaler Inhale 2 puffs into the lungs every 6 (six) hours as needed.    Marland Kitchen alendronate (FOSAMAX) 70 MG tablet Take 1 tablet (70 mg total) by mouth once a week. Take with a full glass of water on an empty stomach. 15 tablet 4  . anastrozole (ARIMIDEX) 1 MG tablet TAKE ONE TABLET BY  MOUTH ONCE DAILY 90 tablet 1  . aspirin 81 MG tablet Take 81 mg by mouth daily.    Marland Kitchen atorvastatin (LIPITOR) 80 MG tablet Take 80 mg by mouth daily.     . calcium carbonate (OS-CAL) 600 MG TABS Take 600 mg by mouth 2 (two) times daily with a meal.    . Cranberry 500 MG CAPS Take 500 mg by mouth 2 (two) times daily.     Marland Kitchen docusate sodium (COLACE) 100 MG capsule Take 100 mg by mouth 4 (four) times daily.    . ergocalciferol (VITAMIN D2) 50000 UNITS capsule Take 50,000 Units by mouth once a week.    . fish oil-omega-3 fatty acids 1000 MG capsule Take 1,000 g by mouth daily.    . Glucosamine-Chondroit-Vit C-Mn (GLUCOSAMINE CHONDR 1500 COMPLX PO) Take 3,000 mg by mouth daily.    Marland Kitchen losartan-hydrochlorothiazide (HYZAAR) 50-12.5 MG per tablet Take 1 tablet by mouth daily.    Marland Kitchen omeprazole (PRILOSEC) 40 MG capsule Take 40 mg by mouth daily.    . Plant Sterols and Stanols (CHOLEST OFF PO) Take 1 tablet by mouth daily.    . polyethylene glycol (MIRALAX / GLYCOLAX) packet Take 17 g by mouth daily.    . Probiotic Product (ALIGN PO) Take 1 tablet by mouth daily.    . Psyllium 48.57 % POWD Take 10 mLs by mouth daily.  No results found for this or any previous visit (from the past 48 hour(s)). No results found.  ROS: Pain with rom of the left upper extremity  Physical Exam:  Alert and oriented 75 y.o. female in no acute distress Cranial nerves 2-12 intact Cervical spine: full rom with no tenderness, nv intact distally Chest: active breath sounds bilaterally, no wheeze rhonchi or rales Heart: regular rate and rhythm, no murmur Abd: non tender non distended with active bowel sounds Hip is stable with rom  Left shoulder with limited rom and strength due to cuff insufficiency nv intact distally No rashes   Assessment/Plan Assessment: left rotator cuff insufficiency   Plan: Patient will undergo a left reverse total shoulder by Dr. Veverly Fells at [facility]. Risks benefits and expectations were  discussed with the patient. Patient understand risks, benefits and expectations and wishes to proceed.

## 2016-01-21 ENCOUNTER — Encounter (HOSPITAL_COMMUNITY)
Admission: RE | Admit: 2016-01-21 | Discharge: 2016-01-21 | Disposition: A | Discharge status: Home / Self Care | Payer: Medicare Other | Source: Ambulatory Visit | Attending: Orthopedic Surgery | Admitting: Orthopedic Surgery

## 2016-01-21 ENCOUNTER — Encounter (HOSPITAL_COMMUNITY): Payer: Self-pay

## 2016-01-21 HISTORY — DX: Personal history of other diseases of the digestive system: Z87.19

## 2016-01-21 HISTORY — DX: Personal history of other diseases of the respiratory system: Z87.09

## 2016-01-21 HISTORY — DX: Unspecified urinary incontinence: R32

## 2016-01-21 LAB — CBC
HCT: 41.6 % (ref 36.0–46.0)
Hemoglobin: 14.1 g/dL (ref 12.0–15.0)
MCH: 28.6 pg (ref 26.0–34.0)
MCHC: 33.9 g/dL (ref 30.0–36.0)
MCV: 84.4 fL (ref 78.0–100.0)
Platelets: 221 10*3/uL (ref 150–400)
RBC: 4.93 MIL/uL (ref 3.87–5.11)
RDW: 12.8 % (ref 11.5–15.5)
WBC: 8.4 10*3/uL (ref 4.0–10.5)

## 2016-01-21 LAB — BASIC METABOLIC PANEL
ANION GAP: 10 (ref 5–15)
BUN: 7 mg/dL (ref 6–20)
CALCIUM: 9.2 mg/dL (ref 8.9–10.3)
CO2: 27 mmol/L (ref 22–32)
Chloride: 98 mmol/L — ABNORMAL LOW (ref 101–111)
Creatinine, Ser: 0.63 mg/dL (ref 0.44–1.00)
GFR calc non Af Amer: >60 mL/min (ref >60)
Glucose, Bld: 137 mg/dL — ABNORMAL HIGH (ref 65–99)
Potassium: 3.3 mmol/L — ABNORMAL LOW (ref 3.5–5.1)
SODIUM: 135 mmol/L (ref 135–145)

## 2016-01-21 LAB — SURGICAL PCR SCREEN
MRSA, PCR: NEGATIVE
Staphylococcus aureus: NEGATIVE

## 2016-01-21 LAB — GLUCOSE, CAPILLARY: GLUCOSE-CAPILLARY: 134 mg/dL — AB (ref 65–99)

## 2016-01-21 NOTE — Progress Notes (Signed)
PCP - Dr. Darcus Austin    -Clearance in chart  Cardiologist - denies but did see Dr. Einar Gip over a year ago for a "fainting episode"  EKG- 01/21/16 - Epic CXR - denies  Echo/Stress test - requested from Dr. Irven Shelling office Cardiac Cath - denies  Patient denies chest pain and shortness of breath at PAT appointment.

## 2016-01-21 NOTE — Pre-Procedure Instructions (Signed)
    Elizabeth Lloyd  01/21/2016      [facility] [address], [place] [address] Phone: [phone] Fax: [phone]    Your procedure is scheduled on Friday, February 3rd, 2017.  Report to [facility] Admitting at 9:00 A.M.  Call this number if you have problems the morning of surgery:  [phone]   Remember:  Do not eat food or drink liquids after midnight.   Take these medicines the morning of surgery with A SIP OF WATER: Albuterol inhaler if needed (please bring with you), Omeprazole (Prilosec).  Stop taking: Aspirin, NSAIDS, Aleve, Naproxen, Ibuprofen, Advil, Motrin, BC's, Goody's, Fish oil, all herbal medications, and all vitamins.    Do not wear jewelry, make-up or nail polish.  Do not wear lotions, powders, or perfumes.  You may NOT wear deodorant.  Do not shave 48 hours prior to surgery.    Do not bring valuables to the hospital.  [facility] is not responsible for any belongings or valuables.  Contacts, dentures or bridgework may not be worn into surgery.  Leave your suitcase in the car.  After surgery it may be brought to your room.  For patients admitted to the hospital, discharge time will be determined by your treatment team.  Patients discharged the day of surgery will not be allowed to drive home.   Special instructions:  See attached.   Please read over the following fact sheets that you were given. Pain Booklet, Coughing and Deep Breathing, Blood Transfusion Information, Total Joint Packet and Surgical Site Infection Prevention     How to Manage Your Diabetes Before Surgery   Why is it important to control my blood sugar before and after surgery?   Improving blood sugar levels before and after surgery helps healing and can limit problems.  A way of improving blood sugar control is eating a healthy diet by:  - Eating less sugar and carbohydrates  - Increasing  activity/exercise  - Talk with your doctor about reaching your blood sugar goals  High blood sugars (greater than 180 mg/dL) can raise your risk of infections and slow down your recovery so you will need to focus on controlling your diabetes during the weeks before surgery.  Make sure that the doctor who takes care of your diabetes knows about your planned surgery including the date and location.  How do I manage my blood sugars before surgery?   Check your blood sugar at least 4 times a day, 2 days before surgery to make sure that they are not too high or low.   Check your blood sugar the morning of your surgery when you wake up and every 2 hours until you get to the Short-Stay unit.  If your blood sugar is less than 70 mg/dL, you will need to treat for low blood sugar by:  Treat a low blood sugar (less than 70 mg/dL) with 1/2 cup of clear juice (cranberry or apple), 4 glucose tablets, OR glucose gel.  Recheck blood sugar in 15 minutes after treatment (to make sure it is greater than 70 mg/dL).  If blood sugar is not greater than 70 mg/dL on re-check, call [phone] for further instructions.   Report your blood sugar to the Short-Stay nurse when you get to Short-Stay.  References:  [facility], 2007 "How to Manage your Diabetes Before and After Surgery".

## 2016-01-22 ENCOUNTER — Inpatient Hospital Stay (HOSPITAL_COMMUNITY): Payer: Medicare Other | Admitting: Anesthesiology

## 2016-01-22 ENCOUNTER — Encounter (HOSPITAL_COMMUNITY): Payer: Self-pay | Admitting: Anesthesiology

## 2016-01-22 ENCOUNTER — Encounter (HOSPITAL_COMMUNITY)
Admission: RE | Disposition: A | Discharge status: Home / Self Care | Payer: Self-pay | Source: Ambulatory Visit | Attending: Orthopedic Surgery

## 2016-01-22 ENCOUNTER — Inpatient Hospital Stay (HOSPITAL_COMMUNITY): Payer: Medicare Other

## 2016-01-22 ENCOUNTER — Inpatient Hospital Stay (HOSPITAL_COMMUNITY)
Admission: RE | Admit: 2016-01-22 | Discharge: 2016-01-24 | DRG: 483 | Disposition: A | Discharge status: Home / Self Care | Payer: Medicare Other | Source: Ambulatory Visit | Attending: Orthopedic Surgery | Admitting: Orthopedic Surgery

## 2016-01-22 DIAGNOSIS — E119 Type 2 diabetes mellitus without complications: Secondary | ICD-10-CM | POA: Diagnosis present

## 2016-01-22 DIAGNOSIS — K219 Gastro-esophageal reflux disease without esophagitis: Secondary | ICD-10-CM | POA: Diagnosis present

## 2016-01-22 DIAGNOSIS — M19012 Primary osteoarthritis, left shoulder: Secondary | ICD-10-CM | POA: Diagnosis present

## 2016-01-22 DIAGNOSIS — Z853 Personal history of malignant neoplasm of breast: Secondary | ICD-10-CM | POA: Diagnosis not present

## 2016-01-22 DIAGNOSIS — Z96619 Presence of unspecified artificial shoulder joint: Secondary | ICD-10-CM

## 2016-01-22 DIAGNOSIS — E785 Hyperlipidemia, unspecified: Secondary | ICD-10-CM | POA: Diagnosis present

## 2016-01-22 DIAGNOSIS — Z96612 Presence of left artificial shoulder joint: Secondary | ICD-10-CM

## 2016-01-22 DIAGNOSIS — Z7982 Long term (current) use of aspirin: Secondary | ICD-10-CM | POA: Diagnosis not present

## 2016-01-22 DIAGNOSIS — I10 Essential (primary) hypertension: Secondary | ICD-10-CM | POA: Diagnosis present

## 2016-01-22 HISTORY — PX: REVERSE SHOULDER ARTHROPLASTY: SHX5054

## 2016-01-22 LAB — GLUCOSE, CAPILLARY
GLUCOSE-CAPILLARY: 106 mg/dL — AB (ref 65–99)
GLUCOSE-CAPILLARY: 110 mg/dL — AB (ref 65–99)
Glucose-Capillary: 145 mg/dL — ABNORMAL HIGH (ref 65–99)

## 2016-01-22 LAB — HEMOGLOBIN A1C
HEMOGLOBIN A1C: 7 % — AB (ref 4.8–5.6)
MEAN PLASMA GLUCOSE: 154 mg/dL

## 2016-01-22 SURGERY — ARTHROPLASTY, SHOULDER, TOTAL, REVERSE
Anesthesia: General | Site: Shoulder | Laterality: Left

## 2016-01-22 MED ORDER — PROPOFOL 10 MG/ML IV BOLUS
INTRAVENOUS | Status: AC
  Filled 2016-01-22: qty 20

## 2016-01-22 MED ORDER — CALCIUM CARBONATE 600 MG PO TABS: 600.0000 mg | ORAL_TABLET | Freq: Two times a day (BID) | ORAL | Status: DC

## 2016-01-22 MED ORDER — 0.9 % SODIUM CHLORIDE (POUR BTL) OPTIME
TOPICAL | Status: DC | PRN
  Administered 2016-01-22: 1000 mL

## 2016-01-22 MED ORDER — RISAQUAD PO CAPS
1.0000 | ORAL_CAPSULE | Freq: Every day | ORAL | Status: DC
  Administered 2016-01-23: 1 via ORAL
  Filled 2016-01-22 (×4): qty 1

## 2016-01-22 MED ORDER — LOSARTAN POTASSIUM-HCTZ 50-12.5 MG PO TABS: 1.0000 | ORAL_TABLET | Freq: Every day | ORAL | Status: DC

## 2016-01-22 MED ORDER — PHENOL 1.4 % MT LIQD
1.0000 | OROMUCOSAL | Status: DC | PRN
Start: 2016-01-22 — End: 2016-01-24

## 2016-01-22 MED ORDER — LOSARTAN POTASSIUM 50 MG PO TABS
50.0000 mg | ORAL_TABLET | Freq: Every day | ORAL | Status: DC
  Administered 2016-01-23 – 2016-01-24 (×2): 50 mg via ORAL
  Filled 2016-01-22 (×2): qty 1

## 2016-01-22 MED ORDER — POLYETHYLENE GLYCOL 3350 17 G PO PACK: 17.0000 g | PACK | Freq: Every day | ORAL | Status: DC | PRN

## 2016-01-22 MED ORDER — ATORVASTATIN CALCIUM 80 MG PO TABS
80.0000 mg | ORAL_TABLET | Freq: Every day | ORAL | Status: DC
  Administered 2016-01-22 – 2016-01-24 (×3): 80 mg via ORAL
  Filled 2016-01-22 (×3): qty 1

## 2016-01-22 MED ORDER — ALENDRONATE SODIUM 70 MG PO TABS: 70.0000 mg | ORAL_TABLET | ORAL | Status: DC

## 2016-01-22 MED ORDER — MENTHOL 3 MG MT LOZG: 1.0000 | LOZENGE | OROMUCOSAL | Status: DC | PRN

## 2016-01-22 MED ORDER — ALIGN 4 MG PO CAPS: ORAL_CAPSULE | Freq: Every day | ORAL | Status: DC

## 2016-01-22 MED ORDER — FENTANYL CITRATE (PF) 100 MCG/2ML IJ SOLN: 25.0000 ug | INTRAMUSCULAR | Status: DC | PRN

## 2016-01-22 MED ORDER — ONDANSETRON HCL 4 MG PO TABS: 4.0000 mg | ORAL_TABLET | Freq: Four times a day (QID) | ORAL | Status: DC | PRN

## 2016-01-22 MED ORDER — BISACODYL 10 MG RE SUPP
10.0000 mg | Freq: Every day | RECTAL | Status: DC | PRN
Start: 2016-01-22 — End: 2016-01-24

## 2016-01-22 MED ORDER — ARTIFICIAL TEARS OP OINT
TOPICAL_OINTMENT | OPHTHALMIC | Status: DC | PRN
  Administered 2016-01-22: 1 via OPHTHALMIC

## 2016-01-22 MED ORDER — METOCLOPRAMIDE HCL 5 MG PO TABS: 5.0000 mg | ORAL_TABLET | Freq: Three times a day (TID) | ORAL | Status: DC | PRN

## 2016-01-22 MED ORDER — CEFAZOLIN SODIUM-DEXTROSE 2-3 GM-% IV SOLR
2.0000 g | INTRAVENOUS | Status: AC
  Administered 2016-01-22: 2 g via INTRAVENOUS
  Filled 2016-01-22: qty 50

## 2016-01-22 MED ORDER — PROMETHAZINE HCL 25 MG/ML IJ SOLN: 6.2500 mg | INTRAMUSCULAR | Status: DC | PRN

## 2016-01-22 MED ORDER — HYDROCODONE-ACETAMINOPHEN 5-325 MG PO TABS
1.0000 | ORAL_TABLET | ORAL | Status: DC | PRN
  Administered 2016-01-22 – 2016-01-24 (×9): 2 via ORAL
  Filled 2016-01-22 (×9): qty 2

## 2016-01-22 MED ORDER — CHLORHEXIDINE GLUCONATE 4 % EX LIQD: 60.0000 mL | Freq: Once | CUTANEOUS | Status: DC

## 2016-01-22 MED ORDER — BUPIVACAINE-EPINEPHRINE (PF) 0.25% -1:200000 IJ SOLN
INTRAMUSCULAR | Status: AC
  Filled 2016-01-22: qty 30

## 2016-01-22 MED ORDER — PHENYLEPHRINE 40 MCG/ML (10ML) SYRINGE FOR IV PUSH (FOR BLOOD PRESSURE SUPPORT)
PREFILLED_SYRINGE | INTRAVENOUS | Status: AC
  Filled 2016-01-22: qty 10

## 2016-01-22 MED ORDER — BUPIVACAINE HCL (PF) 0.5 % IJ SOLN
INTRAMUSCULAR | Status: DC | PRN
  Administered 2016-01-22: 20 mL via PERINEURAL

## 2016-01-22 MED ORDER — LIDOCAINE HCL 4 % EX SOLN
CUTANEOUS | Status: DC | PRN
  Administered 2016-01-22: 3 mL via TOPICAL

## 2016-01-22 MED ORDER — LACTATED RINGERS IV SOLN
INTRAVENOUS | Status: DC | PRN
  Administered 2016-01-22 (×2): via INTRAVENOUS

## 2016-01-22 MED ORDER — LIDOCAINE HCL (CARDIAC) 20 MG/ML IV SOLN
INTRAVENOUS | Status: AC
  Filled 2016-01-22: qty 20

## 2016-01-22 MED ORDER — CRANBERRY 500 MG PO CAPS: 500.0000 mg | ORAL_CAPSULE | ORAL | Status: DC

## 2016-01-22 MED ORDER — CALCIUM CARBONATE 1250 (500 CA) MG PO TABS
1.0000 | ORAL_TABLET | Freq: Two times a day (BID) | ORAL | Status: DC
  Administered 2016-01-22 – 2016-01-24 (×4): 500 mg via ORAL
  Filled 2016-01-22 (×4): qty 1

## 2016-01-22 MED ORDER — DOCUSATE SODIUM 100 MG PO CAPS: 100.0000 mg | ORAL_CAPSULE | Freq: Four times a day (QID) | ORAL | Status: DC

## 2016-01-22 MED ORDER — CEFAZOLIN SODIUM-DEXTROSE 2-3 GM-% IV SOLR
2.0000 g | Freq: Four times a day (QID) | INTRAVENOUS | Status: AC
  Administered 2016-01-22 – 2016-01-23 (×3): 2 g via INTRAVENOUS
  Filled 2016-01-22 (×4): qty 50

## 2016-01-22 MED ORDER — SODIUM CHLORIDE 0.9 % IJ SOLN
INTRAMUSCULAR | Status: AC
  Filled 2016-01-22: qty 20

## 2016-01-22 MED ORDER — SODIUM CHLORIDE 0.9 % IV SOLN
INTRAVENOUS | Status: DC
  Administered 2016-01-22: 18:00:00 via INTRAVENOUS

## 2016-01-22 MED ORDER — ERGOCALCIFEROL 1.25 MG (50000 UT) PO CAPS: 50000.0000 [IU] | ORAL_CAPSULE | ORAL | Status: DC

## 2016-01-22 MED ORDER — VITAMIN D (ERGOCALCIFEROL) 1.25 MG (50000 UNIT) PO CAPS: 50000.0000 [IU] | ORAL_CAPSULE | ORAL | Status: DC

## 2016-01-22 MED ORDER — MIDAZOLAM HCL 2 MG/2ML IJ SOLN
INTRAMUSCULAR | Status: AC
Start: 2016-01-22 — End: 2016-01-22
  Administered 2016-01-22: 2 mg
  Filled 2016-01-22: qty 2

## 2016-01-22 MED ORDER — OMEGA-3-ACID ETHYL ESTERS 1 G PO CAPS
1.0000 g | ORAL_CAPSULE | Freq: Every day | ORAL | Status: DC
  Administered 2016-01-22 – 2016-01-24 (×3): 1 g via ORAL
  Filled 2016-01-22 (×4): qty 1

## 2016-01-22 MED ORDER — SUCCINYLCHOLINE CHLORIDE 20 MG/ML IJ SOLN
INTRAMUSCULAR | Status: AC
  Filled 2016-01-22: qty 2

## 2016-01-22 MED ORDER — METHOCARBAMOL 1000 MG/10ML IJ SOLN: 500.0000 mg | Freq: Four times a day (QID) | INTRAVENOUS | Status: DC | PRN

## 2016-01-22 MED ORDER — ONDANSETRON HCL 4 MG/2ML IJ SOLN
INTRAMUSCULAR | Status: DC | PRN
  Administered 2016-01-22: 4 mg via INTRAVENOUS

## 2016-01-22 MED ORDER — OMEGA-3 FATTY ACIDS 1000 MG PO CAPS: 1000.0000 g | ORAL_CAPSULE | Freq: Every day | ORAL | Status: DC

## 2016-01-22 MED ORDER — METOCLOPRAMIDE HCL 5 MG/ML IJ SOLN: 5.0000 mg | Freq: Three times a day (TID) | INTRAMUSCULAR | Status: DC | PRN

## 2016-01-22 MED ORDER — HYDROCODONE-ACETAMINOPHEN 5-325 MG PO TABS: 1.0000 | ORAL_TABLET | Freq: Four times a day (QID) | ORAL | Status: DC | PRN

## 2016-01-22 MED ORDER — BUPIVACAINE-EPINEPHRINE 0.25% -1:200000 IJ SOLN
INTRAMUSCULAR | Status: DC | PRN
Start: 2016-01-22 — End: 2016-01-22
  Administered 2016-01-22: 9 mL

## 2016-01-22 MED ORDER — PHENYLEPHRINE HCL 10 MG/ML IJ SOLN
10.0000 mg | INTRAVENOUS | Status: DC | PRN
  Administered 2016-01-22: 25 ug/min via INTRAVENOUS

## 2016-01-22 MED ORDER — EPHEDRINE SULFATE 50 MG/ML IJ SOLN
INTRAMUSCULAR | Status: DC | PRN
  Administered 2016-01-22 (×3): 5 mg via INTRAVENOUS
  Administered 2016-01-22: 10 mg via INTRAVENOUS

## 2016-01-22 MED ORDER — DOCUSATE SODIUM 100 MG PO CAPS
100.0000 mg | ORAL_CAPSULE | Freq: Two times a day (BID) | ORAL | Status: DC
  Administered 2016-01-22 – 2016-01-24 (×4): 100 mg via ORAL
  Filled 2016-01-22 (×4): qty 1

## 2016-01-22 MED ORDER — METHOCARBAMOL 500 MG PO TABS: 500.0000 mg | ORAL_TABLET | Freq: Three times a day (TID) | ORAL | Status: DC | PRN

## 2016-01-22 MED ORDER — ACETAMINOPHEN 325 MG PO TABS: 650.0000 mg | ORAL_TABLET | Freq: Four times a day (QID) | ORAL | Status: DC | PRN

## 2016-01-22 MED ORDER — LACTATED RINGERS IV SOLN
INTRAVENOUS | Status: DC
  Administered 2016-01-22: 10:00:00 via INTRAVENOUS

## 2016-01-22 MED ORDER — ONDANSETRON HCL 4 MG/2ML IJ SOLN
INTRAMUSCULAR | Status: AC
  Filled 2016-01-22: qty 4

## 2016-01-22 MED ORDER — MORPHINE SULFATE (PF) 2 MG/ML IV SOLN: 2.0000 mg | INTRAVENOUS | Status: DC | PRN

## 2016-01-22 MED ORDER — ARTIFICIAL TEARS OP OINT
TOPICAL_OINTMENT | OPHTHALMIC | Status: AC
  Filled 2016-01-22: qty 7

## 2016-01-22 MED ORDER — ANASTROZOLE 1 MG PO TABS
1.0000 mg | ORAL_TABLET | Freq: Every day | ORAL | Status: DC
  Administered 2016-01-23: 1 mg via ORAL
  Filled 2016-01-22 (×4): qty 1

## 2016-01-22 MED ORDER — ALBUTEROL SULFATE (2.5 MG/3ML) 0.083% IN NEBU: 3.0000 mL | INHALATION_SOLUTION | Freq: Four times a day (QID) | RESPIRATORY_TRACT | Status: DC | PRN

## 2016-01-22 MED ORDER — FENTANYL CITRATE (PF) 100 MCG/2ML IJ SOLN
INTRAMUSCULAR | Status: AC
  Administered 2016-01-22: 50 ug
  Filled 2016-01-22: qty 2

## 2016-01-22 MED ORDER — EPHEDRINE SULFATE 50 MG/ML IJ SOLN
INTRAMUSCULAR | Status: AC
  Filled 2016-01-22: qty 2

## 2016-01-22 MED ORDER — POLYETHYLENE GLYCOL 3350 17 G PO PACK
17.0000 g | PACK | Freq: Every day | ORAL | Status: DC
  Administered 2016-01-23 – 2016-01-24 (×2): 17 g via ORAL
  Filled 2016-01-22 (×2): qty 1

## 2016-01-22 MED ORDER — METHOCARBAMOL 500 MG PO TABS
500.0000 mg | ORAL_TABLET | Freq: Four times a day (QID) | ORAL | Status: DC | PRN
  Administered 2016-01-23 (×2): 500 mg via ORAL
  Filled 2016-01-22 (×2): qty 1

## 2016-01-22 MED ORDER — ACETAMINOPHEN 650 MG RE SUPP: 650.0000 mg | Freq: Four times a day (QID) | RECTAL | Status: DC | PRN

## 2016-01-22 MED ORDER — ONDANSETRON HCL 4 MG/2ML IJ SOLN: 4.0000 mg | Freq: Four times a day (QID) | INTRAMUSCULAR | Status: DC | PRN

## 2016-01-22 MED ORDER — PROPOFOL 10 MG/ML IV BOLUS
INTRAVENOUS | Status: DC | PRN
  Administered 2016-01-22: 130 mg via INTRAVENOUS

## 2016-01-22 MED ORDER — LIDOCAINE HCL (CARDIAC) 20 MG/ML IV SOLN
INTRAVENOUS | Status: DC | PRN
  Administered 2016-01-22: 60 mg via INTRAVENOUS

## 2016-01-22 MED ORDER — SUCCINYLCHOLINE CHLORIDE 20 MG/ML IJ SOLN
INTRAMUSCULAR | Status: DC | PRN
  Administered 2016-01-22: 100 mg via INTRAVENOUS

## 2016-01-22 MED ORDER — HYDROCHLOROTHIAZIDE 12.5 MG PO CAPS
12.5000 mg | ORAL_CAPSULE | Freq: Every day | ORAL | Status: DC
  Administered 2016-01-23 – 2016-01-24 (×2): 12.5 mg via ORAL
  Filled 2016-01-22 (×2): qty 1

## 2016-01-22 MED ORDER — ASPIRIN EC 81 MG PO TBEC
81.0000 mg | DELAYED_RELEASE_TABLET | Freq: Every day | ORAL | Status: DC
  Administered 2016-01-22 – 2016-01-24 (×3): 81 mg via ORAL
  Filled 2016-01-22 (×3): qty 1

## 2016-01-22 MED ORDER — GLUCOSAMINE CHONDR 1500 COMPLX PO CAPS: ORAL_CAPSULE | Freq: Every day | ORAL | Status: DC

## 2016-01-22 MED ORDER — GLYCOPYRROLATE 0.2 MG/ML IJ SOLN
INTRAMUSCULAR | Status: AC
  Filled 2016-01-22: qty 2

## 2016-01-22 MED ORDER — LIDOCAINE-EPINEPHRINE (PF) 1.5 %-1:200000 IJ SOLN
INTRAMUSCULAR | Status: DC | PRN
  Administered 2016-01-22: 10 mL via PERINEURAL

## 2016-01-22 MED ORDER — PANTOPRAZOLE SODIUM 40 MG PO TBEC
80.0000 mg | DELAYED_RELEASE_TABLET | Freq: Every day | ORAL | Status: DC
  Administered 2016-01-23 – 2016-01-24 (×2): 80 mg via ORAL
  Filled 2016-01-22 (×2): qty 2

## 2016-01-22 MED ORDER — PSYLLIUM 95 % PO PACK
1.0000 | PACK | Freq: Every day | ORAL | Status: DC
  Administered 2016-01-23: 1 via ORAL
  Filled 2016-01-22 (×3): qty 1

## 2016-01-22 SURGICAL SUPPLY — 71 items
BIT DRILL 170X2.5X (BIT) IMPLANT
BIT DRILL 5/64X5 DISP (BIT) ×2 IMPLANT
BIT DRL 170X2.5X (BIT)
BLADE SAG 18X100X1.27 (BLADE) ×2 IMPLANT
CAPT SHLDR REVTOTAL 1 ×1 IMPLANT
COVER SURGICAL LIGHT HANDLE (MISCELLANEOUS) ×2 IMPLANT
DRAPE IMP U-DRAPE 54X76 (DRAPES) ×4 IMPLANT
DRAPE INCISE IOBAN 66X45 STRL (DRAPES) ×2 IMPLANT
DRAPE ORTHO SPLIT 77X108 STRL (DRAPES) ×4
DRAPE PROXIMA HALF (DRAPES) ×1 IMPLANT
DRAPE SURG ORHT 6 SPLT 77X108 (DRAPES) ×2 IMPLANT
DRAPE U-SHAPE 47X51 STRL (DRAPES) ×2 IMPLANT
DRAPE X-RAY CASS 24X20 (DRAPES) IMPLANT
DRILL 2.5 (BIT)
DRSG ADAPTIC 3X8 NADH LF (GAUZE/BANDAGES/DRESSINGS) ×2 IMPLANT
DRSG PAD ABDOMINAL 8X10 ST (GAUZE/BANDAGES/DRESSINGS) ×2 IMPLANT
DURAPREP 26ML APPLICATOR (WOUND CARE) ×2 IMPLANT
ELECT BLADE 4.0 EZ CLEAN MEGAD (MISCELLANEOUS) ×2
ELECT NDL TIP 2.8 STRL (NEEDLE) ×1 IMPLANT
ELECT NEEDLE TIP 2.8 STRL (NEEDLE) ×2 IMPLANT
ELECT REM PT RETURN 9FT ADLT (ELECTROSURGICAL) ×2
ELECTRODE BLDE 4.0 EZ CLN MEGD (MISCELLANEOUS) ×1 IMPLANT
ELECTRODE REM PT RTRN 9FT ADLT (ELECTROSURGICAL) ×1 IMPLANT
GAUZE SPONGE 4X4 12PLY STRL (GAUZE/BANDAGES/DRESSINGS) ×2 IMPLANT
GLOVE BIOGEL PI ORTHO PRO 7.5 (GLOVE) ×1
GLOVE BIOGEL PI ORTHO PRO SZ8 (GLOVE) ×1
GLOVE ORTHO TXT STRL SZ7.5 (GLOVE) ×2 IMPLANT
GLOVE PI ORTHO PRO STRL 7.5 (GLOVE) ×1 IMPLANT
GLOVE PI ORTHO PRO STRL SZ8 (GLOVE) ×1 IMPLANT
GLOVE SURG ORTHO 8.5 STRL (GLOVE) ×2 IMPLANT
GOWN STRL REUS W/ TWL LRG LVL3 (GOWN DISPOSABLE) ×1 IMPLANT
GOWN STRL REUS W/ TWL XL LVL3 (GOWN DISPOSABLE) ×2 IMPLANT
GOWN STRL REUS W/TWL LRG LVL3 (GOWN DISPOSABLE) ×2
GOWN STRL REUS W/TWL XL LVL3 (GOWN DISPOSABLE) ×4
HANDPIECE INTERPULSE COAX TIP (DISPOSABLE)
KIT BASIN OR (CUSTOM PROCEDURE TRAY) ×2 IMPLANT
KIT ROOM TURNOVER OR (KITS) ×2 IMPLANT
MANIFOLD NEPTUNE II (INSTRUMENTS) ×2 IMPLANT
NDL 1/2 CIR MAYO (NEEDLE) ×1 IMPLANT
NDL HYPO 25GX1X1/2 BEV (NEEDLE) ×1 IMPLANT
NEEDLE 1/2 CIR MAYO (NEEDLE) ×2 IMPLANT
NEEDLE HYPO 25GX1X1/2 BEV (NEEDLE) ×4 IMPLANT
NS IRRIG 1000ML POUR BTL (IV SOLUTION) ×2 IMPLANT
PACK SHOULDER (CUSTOM PROCEDURE TRAY) ×2 IMPLANT
PAD ARMBOARD 7.5X6 YLW CONV (MISCELLANEOUS) ×4 IMPLANT
PIN GUIDE 1.2 (PIN) IMPLANT
PIN GUIDE GLENOPHERE 1.5MX300M (PIN) IMPLANT
PIN METAGLENE 2.5 (PIN) IMPLANT
SET HNDPC FAN SPRY TIP SCT (DISPOSABLE) IMPLANT
SLING ARM LRG ADULT FOAM STRAP (SOFTGOODS) IMPLANT
SLING ARM MED ADULT FOAM STRAP (SOFTGOODS) IMPLANT
SPONGE GAUZE 4X4 12PLY STER LF (GAUZE/BANDAGES/DRESSINGS) ×1 IMPLANT
SPONGE LAP 18X18 X RAY DECT (DISPOSABLE) IMPLANT
SPONGE LAP 4X18 X RAY DECT (DISPOSABLE) ×2 IMPLANT
STRIP CLOSURE SKIN 1/2X4 (GAUZE/BANDAGES/DRESSINGS) ×2 IMPLANT
SUCTION FRAZIER HANDLE 10FR (MISCELLANEOUS) ×1
SUCTION TUBE FRAZIER 10FR DISP (MISCELLANEOUS) ×1 IMPLANT
SUT FIBERWIRE #2 38 T-5 BLUE (SUTURE) ×4
SUT MNCRL AB 4-0 PS2 18 (SUTURE) ×2 IMPLANT
SUT VIC AB 2-0 CT1 27 (SUTURE) ×2
SUT VIC AB 2-0 CT1 TAPERPNT 27 (SUTURE) ×1 IMPLANT
SUT VICRYL 0 CT 1 36IN (SUTURE) ×2 IMPLANT
SUTURE FIBERWR #2 38 T-5 BLUE (SUTURE) ×2 IMPLANT
SYR CONTROL 10ML LL (SYRINGE) ×2 IMPLANT
TAPE CLOTH SURG 6X10 WHT LF (GAUZE/BANDAGES/DRESSINGS) ×1 IMPLANT
TOWEL OR 17X24 6PK STRL BLUE (TOWEL DISPOSABLE) ×2 IMPLANT
TOWEL OR 17X26 10 PK STRL BLUE (TOWEL DISPOSABLE) ×2 IMPLANT
TOWER CARTRIDGE SMART MIX (DISPOSABLE) IMPLANT
TRAY FOLEY CATH 16FRSI W/METER (SET/KITS/TRAYS/PACK) IMPLANT
WATER STERILE IRR 1000ML POUR (IV SOLUTION) ×2 IMPLANT
YANKAUER SUCT BULB TIP NO VENT (SUCTIONS) ×2 IMPLANT

## 2016-01-22 NOTE — Anesthesia Procedure Notes (Addendum)
Anesthesia Regional Block:  Interscalene brachial plexus block  Pre-Anesthetic Checklist: ,, timeout performed, Correct Patient, Correct Site, Correct Laterality, Correct Procedure, Correct Position, site marked, Risks and benefits discussed,  Surgical consent,  Pre-op evaluation,  At surgeon's request and post-op pain management  Laterality: Left  Prep: chloraprep       Needles:  Injection technique: Single-shot  Needle Type: Echogenic Stimulator Needle     Needle Length: 9cm 9 cm Needle Gauge: 21 and 21 G    Additional Needles:  Procedures: ultrasound guided (picture in chart) Interscalene brachial plexus block Narrative:  Injection made incrementally with aspirations every 5 mL.  Performed by: Personally  Anesthesiologist: ROSE, Iona Beard  Additional Notes: Patient tolerated the procedure well without complications   Procedure Name: Intubation Date/Time: 01/22/2016 11:09 AM Performed by: Suzy Bouchard Pre-anesthesia Checklist: Patient identified, Emergency Drugs available, Suction available, Timeout performed and Patient being monitored Patient Re-evaluated:Patient Re-evaluated prior to inductionOxygen Delivery Method: Circle system utilized Preoxygenation: Pre-oxygenation with 100% oxygen Intubation Type: IV induction Ventilation: Mask ventilation without difficulty Laryngoscope Size: Miller and 2 Grade View: Grade I Tube type: Oral Laser Tube: Cuffed inflated with minimal occlusive pressure - saline Tube size: 7.0 mm Number of attempts: 1 Airway Equipment and Method: Stylet and LTA kit utilized Placement Confirmation: ETT inserted through vocal cords under direct vision,  positive ETCO2 and breath sounds checked- equal and bilateral Secured at: 20 cm Tube secured with: Tape Dental Injury: Teeth and Oropharynx as per pre-operative assessment

## 2016-01-22 NOTE — Brief Op Note (Signed)
01/22/2016  1:31 PM  PATIENT:  Elizabeth Lloyd  75 y.o. 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  PROCEDURE:  Procedure(s): LEFT REVERSE TOTAL SHOULDER ARTHROPLASTY (Left) DePuy Delta Xtend  SURGEON:  Surgeon(s) and Role:    * Netta Cedars, MD - Primary  PHYSICIAN ASSISTANT:   ASSISTANTS: Ventura Bruns, PA-C   ANESTHESIA:   regional and general  EBL:  Total I/O In: 1200 [I.V.:1200] Out: 150 [Blood:150]  BLOOD ADMINISTERED:none  DRAINS: none   LOCAL MEDICATIONS USED:  MARCAINE     SPECIMEN:  No Specimen  DISPOSITION OF SPECIMEN:  N/A  COUNTS:  YES  TOURNIQUET:  * No tourniquets in log *  DICTATION: .Other Dictation: Dictation Number [id number]  PLAN OF CARE: Admit to inpatient   PATIENT DISPOSITION:  PACU - hemodynamically stable.   Delay start of Pharmacological VTE agent (>24hrs) due to surgical blood loss or risk of bleeding: yes

## 2016-01-22 NOTE — Op Note (Signed)
NAME:  Elizabeth Lloyd, Elizabeth Lloyd NO.:  [id number]  MEDICAL RECORD NO.:  [id number]  LOCATION:  [facility]                         FACILITY:  [facility]  PHYSICIAN:  Doran Heater. Veverly Fells, M.D. DATE OF BIRTH:  [birth year]  DATE OF PROCEDURE: DATE OF DISCHARGE:                              OPERATIVE REPORT   PREOPERATIVE DIAGNOSIS:  Left shoulder osteoarthritis and rotator cuff insufficiency.  POSTOPERATIVE DIAGNOSIS:  Left shoulder osteoarthritis and rotator cuff insufficiency.  PROCEDURE PERFORMED:  Left shoulder reverse total shoulder arthroplasty using DePuy Delta Xtend prosthesis.  ATTENDING SURGEON:  Doran Heater. Veverly Fells, MD.  ASSISTANT:  Abbott Pao. Dixon, PA-C, who scrubbed the entire procedure and necessary for satisfactory completion of surgery.  ANESTHESIA:  General anesthesia was used plus interscalene block.  ESTIMATED BLOOD LOSS:  100 mL.  FLUID REPLACEMENT:  1500 mL crystalloid.  COUNTS:  Instrument counts correct.  COMPLICATIONS:  There were no complications.  ANTIBIOTICS:  Perioperative antibiotics were given.  INDICATIONS:  The patient is a 75 year old female who presents with worsening left shoulder pain and loss of function secondary to rotator cuff tear arthropathy.  The patient has had progressive pain despite conservative management and presents with x-ray and MRI documentation of rotator cuff tear arthropathy.  She has failed all measures of conservative management and desires operative treatment in the form of a reverse total shoulder arthroplasty to restore function and eliminate pain to the shoulder.  Informed consent obtained.  DESCRIPTION OF PROCEDURE:  After an adequate level anesthesia was achieved, the patient was positioned in the modified beach chair position.  Left shoulder correctly identified and sterilely prepped and draped in usual manner.  Time-out called.  We entered the patient's shoulder in standard deltopectoral approach starting at  coracoid process and extending down to the anterior humerus.  Dissection down through subcutaneous tissues using Bovie identified the cephalic vein, taken laterally with the deltoid, pectoralis taken medially.  Conjoint tendon identified and retracted medially.  The subscapularis which was diminutive and thin was released off the lesser tuberosity and tagged for retraction to protect the axillary nerve.  The inferior capsule was released off the inferior neck of the humerus where there were large spurs.  We then released the leading edge of the supraspinatus which was also thin and torn.  Biceps was tenodesed and then released.  We then went ahead and entered the proximal humerus.  Once that was delivered with extension and external rotation out of the wound, we used a 6 mm reamer to initiate our canal reaming.  We then reamed up to size 12.  We then placed our 12 neck resection guide in place and resected the neck based off in an intramedullary reference, set on 10 degrees of retroversion.  Once we had resected with an oscillating saw the head, we removed excess spurs using osteotome.  We then completed our metaphyseal preparation with the reamer for the epi 1 left metaphysis.  We then impacted the trial stem in place, retained that in to protect the bone on the humeral side.  We then retracted the humerus posteriorly, did a 360-degree capsular release to remove supraspinatus infraspinatus muscles and tendon remnants which was extensively scarred and really appeared non  functional. We also removed the glenoid labrum and the biceps stump.  Once we had 360 degree exposure of glenoid face, we placed a central guide pin reamed for the metaglene and drilled our central peg hole for the metaglene and impacted the metaglene into position centered inferiorly.  We then went ahead and placed our screws. We had a 42 locked inferiorly, 24 locked superiorly, and 18 nonlocked posteriorly.  Could not  get 1 anteriorly due to not having enough bone support for that screw. but we had excellent fixation with the metaglene.  We placed a 38 eccentric glenosphere in position dialed inferiorly, it gave [place] a little extra clearance.  We irrigated thoroughly.  Once we got our glenosphere impacted and screwed into position, we trialed with a 38+ 6 poly.  It was a little too tight, so we trialed again with a 38+ 3 and we were happy with that fit.  We removed the trial components from the humerus, irrigated thoroughly, and then impacted the HA-coated 12 body epi 1 left metaphysis, set on 0 setting, placed in 10 degrees of retroversion.  Once that was impacted, it was quite secure. We then selected real 38+ 3 poly, impacted that in position, reduced the shoulder with a nice pop.  We had nice resting tension, little tight in extension but the axillary nerve was not under undue tension and the shoulder was stable.  Everything moved together as I pulled inferiorly in the elbow and no gapping with external rotation. We checked 1 more time the axillary nerve, it was in good condition.  We resected the diminutive subscapularis that was not beneficial and could not be repaired.  Once we had that irrigated, we did rotate teres minor posteriorly though.  Once we had everything irrigated, we closed deltopectoral closure with 0 Vicryl suture followed by 2-0 Vicryl subcutaneous closure and 4-0 Monocryl for skin.  Steri-Strips applied followed by sterile dressing.  The patient tolerated the surgery well.     Doran Heater. Veverly Fells, M.D.     SRN/MEDQ  D:  01/22/2016  T:  01/22/2016  Job:  [id number]

## 2016-01-22 NOTE — Discharge Instructions (Signed)
Ice to the shoulder as much as possible.  Keep incision covered and clean and dry for one week, then ok to get it wet in the shower.  Ok to remove sling and move and use the arm for light activity.  No pushing out of a chair.  Prop a pillow behind the elbow to keep the arm across your waist.  Follow up in two weeks in the office  [phone]

## 2016-01-22 NOTE — Progress Notes (Signed)
Utilization review completed.  

## 2016-01-22 NOTE — Transfer of Care (Signed)
Immediate Anesthesia Transfer of Care Note  Patient: Elizabeth Lloyd  Procedure(s) Performed: Procedure(s): LEFT REVERSE TOTAL SHOULDER ARTHROPLASTY (Left)  Patient Location: PACU  Anesthesia Type:GA combined with regional for post-op pain  Level of Consciousness: awake, alert  and oriented  Airway & Oxygen Therapy: Patient Spontanous Breathing and Patient connected to nasal cannula oxygen  Post-op Assessment: Report given to RN, Post -op Vital signs reviewed and stable and Patient moving all extremities  Post vital signs: Reviewed and stable  Last Vitals:  Filed Vitals:   01/22/16 1000 01/22/16 1005  BP:  114/53  Pulse: 54 56  Temp:    Resp: 15 17    Complications: No apparent anesthesia complications

## 2016-01-22 NOTE — Anesthesia Postprocedure Evaluation (Signed)
Anesthesia Post Note  Patient: Elizabeth Lloyd  Procedure(s) Performed: Procedure(s) (LRB): LEFT REVERSE TOTAL SHOULDER ARTHROPLASTY (Left)  Patient location during evaluation: PACU Anesthesia Type: General and Regional Level of consciousness: awake and alert Pain management: pain level controlled Vital Signs Assessment: post-procedure vital signs reviewed and stable Respiratory status: spontaneous breathing, nonlabored ventilation, respiratory function stable and patient connected to nasal cannula oxygen Cardiovascular status: blood pressure returned to baseline and stable Postop Assessment: no signs of nausea or vomiting Anesthetic complications: no    Last Vitals:  Filed Vitals:   01/22/16 1330 01/22/16 1345  BP: 107/61   Pulse: 74 71  Temp:    Resp: 0 3    Last Pain:  Filed Vitals:   01/22/16 1347  PainSc: 0-No pain      LLE Sensation: Numbness (due to pre-op block) (01/22/16 1345)          Eleanora Guinyard S

## 2016-01-22 NOTE — Anesthesia Preprocedure Evaluation (Signed)
Anesthesia Evaluation  Patient identified by MRN, date of birth, ID band Patient awake    Reviewed: Allergy & Precautions, NPO status , Patient's Chart, lab work & pertinent test results  Airway Mallampati: II  TM Distance: >3 FB Neck ROM: Full    Dental  (+) Upper Dentures, Lower Dentures, Dental Advisory Given   Pulmonary neg pulmonary ROS,    Pulmonary exam normal breath sounds clear to auscultation       Cardiovascular hypertension, Pt. on medications Normal cardiovascular exam Rhythm:Regular Rate:Normal     Neuro/Psych negative neurological ROS  negative psych ROS   GI/Hepatic negative GI ROS, Neg liver ROS,   Endo/Other  diabetesMorbid obesity  Renal/GU negative Renal ROS  negative genitourinary   Musculoskeletal negative musculoskeletal ROS (+)   Abdominal   Peds negative pediatric ROS (+)  Hematology negative hematology ROS (+)   Anesthesia Other Findings   Reproductive/Obstetrics negative OB ROS                             Anesthesia Physical Anesthesia Plan  ASA: III  Anesthesia Plan: General   Post-op Pain Management: GA combined w/ Regional for post-op pain   Induction: Intravenous  Airway Management Planned: Oral ETT  Additional Equipment:   Intra-op Plan:   Post-operative Plan: Extubation in OR  Informed Consent: I have reviewed the patients History and Physical, chart, labs and discussed the procedure including the risks, benefits and alternatives for the proposed anesthesia with the patient or authorized representative who has indicated his/her understanding and acceptance.   Dental advisory given  Plan Discussed with: CRNA and Surgeon  Anesthesia Plan Comments:         Anesthesia Quick Evaluation

## 2016-01-22 NOTE — Progress Notes (Signed)
Report given to Peighton Edgin rn as caregiver 

## 2016-01-22 NOTE — Interval H&P Note (Signed)
History and Physical Interval Note:  01/22/2016 10:26 AM  Elizabeth Lloyd  has presented today for surgery, with the diagnosis of LEFT SHOULDER OA AND ROTATO CUFF INSUFFIENCY   The various methods of treatment have been discussed with the patient and family. After consideration of risks, benefits and other options for treatment, the patient has consented to  Procedure(s): LEFT REVERSE TOTAL SHOULDER ARTHROPLASTY (Left) as a surgical intervention .  The patient's history has been reviewed, patient examined, no change in status, stable for surgery.  I have reviewed the patient's chart and labs.  Questions were answered to the patient's satisfaction.     [name],STEVEN R

## 2016-01-23 LAB — BASIC METABOLIC PANEL
Anion gap: 9 (ref 5–15)
BUN: 8 mg/dL (ref 6–20)
CALCIUM: 8.5 mg/dL — AB (ref 8.9–10.3)
CHLORIDE: 98 mmol/L — AB (ref 101–111)
CO2: 26 mmol/L (ref 22–32)
CREATININE: 0.59 mg/dL (ref 0.44–1.00)
Glucose, Bld: 133 mg/dL — ABNORMAL HIGH (ref 65–99)
Potassium: 3.5 mmol/L (ref 3.5–5.1)
SODIUM: 133 mmol/L — AB (ref 135–145)

## 2016-01-23 LAB — HEMOGLOBIN AND HEMATOCRIT, BLOOD
HEMATOCRIT: 35.4 % — AB (ref 36.0–46.0)
HEMOGLOBIN: 12 g/dL (ref 12.0–15.0)

## 2016-01-23 NOTE — Progress Notes (Signed)
Occupational Therapy Treatment Patient Details Name: Elizabeth Lloyd MRN: [id number] DOB: 04-17-1941 Today's Date: 01/23/2016    History of present illness 75 y.o. female s/p L reverse total shoulder arthroplasty. PMH significant for HTN, HLD, GERD, DM, arthritis, breast cancer, bilateral hand surgery, and L knee surgery.   OT comments  Educated pt on LUE HEP including shoulder flexion, abduction, and external rotation. Pt completed 10 reps of each exercise and reported increased pain at end of session - educated pt to use ice after completing exercises. Pt declined to practice dressing or other transfers after completing exercises due to pain. Will continue to follow acutely to address OT needs and goals.    Follow Up Recommendations  Other (comment) (Progress to Outpatient OT at MD discretion)    Equipment Recommendations  None recommended by OT    Recommendations for Other Services      Precautions / Restrictions Precautions Precautions: Shoulder Type of Shoulder Precautions: Active protocol Shoulder Interventions: Shoulder sling/immobilizer;For comfort Precaution Booklet Issued: Yes (comment) Precaution Comments: A/AROM shoulder FF 0-90, ABD 0-60, ER 0-30; AROM elbow, wrist, hand to tolerance Required Braces or Orthoses: Sling Restrictions Weight Bearing Restrictions: Yes LUE Weight Bearing: Non weight bearing       Mobility Bed Mobility Overal bed mobility: Needs Assistance Bed Mobility: Supine to Sit     Supine to sit: Min assist     General bed mobility comments: Pt up in chair on OT arrival  Transfers Overall transfer level: Needs assistance Equipment used: None Transfers: Sit to/from Stand Sit to Stand: Min guard         General transfer comment: No transfers attempted this session after UE HEP pt reporting increased pain    Balance Overall balance assessment: Needs assistance Sitting-balance support: No upper extremity supported;Feet  supported Sitting balance-Leahy Scale: Good     Standing balance support: No upper extremity supported;During functional activity Standing balance-Leahy Scale: Good                     ADL Overall ADL's : Needs assistance/impaired     Grooming: Wash/dry hands;Supervision/safety;Standing   Upper Body Bathing: Set up;Cueing for UE precautions;Standing   Lower Body Bathing: Set up;Sit to/from stand   Upper Body Dressing : Set up;Sitting;Cueing for UE precautions   Lower Body Dressing: Set up;Sit to/from stand   Toilet Transfer: Min guard;Ambulation;Comfort height toilet;Grab bars   Toileting- Clothing Manipulation and Hygiene: Min guard;Sit to/from stand       Functional mobility during ADLs: Min guard General ADL Comments: Practiced donning/doffing sling and reviewed donning/doffing clothing - pt declined to practice. Completed UE exercises      Vision                     Perception     Praxis      Cognition   Behavior During Therapy: WFL for tasks assessed/performed Overall Cognitive Status: Within Functional Limits for tasks assessed                       Extremity/Trunk Assessment  Upper Extremity Assessment Upper Extremity Assessment: LUE deficits/detail LUE Deficits / Details: decreased AROM and strength as expected post op LUE: Unable to fully assess due to immobilization;Unable to fully assess due to pain   Lower Extremity Assessment Lower Extremity Assessment: Overall WFL for tasks assessed   Cervical / Trunk Assessment Cervical / Trunk Assessment: Normal    Exercises Shoulder Exercises Shoulder Flexion: AAROM;Left;10  reps;Seated (0-90, 3x/day) Shoulder ABduction: AAROM;Left;10 reps;Seated (0-60, 3x/day) Shoulder External Rotation: AAROM;Left;10 reps;Seated (0-30, 3x/day) Elbow Flexion: AROM;Left;10 reps;Seated (as much as tolerated) Elbow Extension: AROM;10 reps;Seated (as much as tolerated) Wrist Flexion: AROM;Left;10  reps;Seated (as much as tolerated) Wrist Extension: AROM;Left;10 reps;Seated (as much as tolerated) Digit Composite Flexion: AROM;Left;10 reps;Seated (as much as tolerated) Composite Extension: AROM;Left;10 reps;Seated (as much as tolerated) Donning/doffing shirt without moving shoulder: Minimal assistance Method for sponge bathing under operated UE: Minimal assistance Donning/doffing sling/immobilizer: Minimal assistance Correct positioning of sling/immobilizer: Minimal assistance ROM for elbow, wrist and digits of operated UE: Supervision/safety Sling wearing schedule (on at all times/off for ADL's): Supervision/safety Proper positioning of operated UE when showering: Minimal assistance Positioning of UE while sleeping: Minimal assistance   Shoulder Instructions Shoulder Instructions Donning/doffing shirt without moving shoulder: Minimal assistance Method for sponge bathing under operated UE: Minimal assistance Donning/doffing sling/immobilizer: Minimal assistance Correct positioning of sling/immobilizer: Minimal assistance ROM for elbow, wrist and digits of operated UE: Supervision/safety Sling wearing schedule (on at all times/off for ADL's): Supervision/safety Proper positioning of operated UE when showering: Minimal assistance Positioning of UE while sleeping: Minimal assistance     General Comments      Pertinent Vitals/ Pain       Pain Assessment: 0-10 Pain Score: 7  Pain Location: L shoulder Pain Descriptors / Indicators: Aching;Sore Pain Intervention(s): Limited activity within patient's tolerance;Monitored during session;Premedicated before session;Repositioned;Ice applied  Home Living Family/patient expects to be discharged to:: Private residence Living Arrangements: Children Available Help at Discharge: Family;Available 24 hours/day Type of Home: House Home Access: Stairs to enter [employer] of Steps: 2 Entrance Stairs-Rails: None Home Layout: One  level     Bathroom Shower/Tub: Walk-in Hydrologist: Handicapped height     Home Equipment: None          Prior Functioning/Environment Level of Independence: Independent        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, Designer, fashion/clothing 2X/week     Progress Toward Goals  OT Goals(current goals can now be found in the care plan section)  Progress towards OT goals: Progressing toward goals  Acute Rehab OT Goals Patient Stated Goal: to get back to work OT Goal Formulation: With patient Time For Goal Achievement: 02/06/16 Potential to Achieve Goals: Good ADL Goals Pt Will Perform Grooming: with modified independence;standing Pt Will Perform Upper Body Dressing: with modified independence;standing Pt Will Perform Lower Body Dressing: with modified independence;sit to/from stand Pt Will Transfer to Toilet: with modified independence;ambulating;regular height toilet Pt Will Perform Toileting - Clothing Manipulation and hygiene: with modified independence;sit to/from stand Pt Will Perform Tub/Shower Transfer: with modified independence;ambulating Pt/caregiver will Perform Home Exercise Program: Increased ROM;Left upper extremity;Independently;With written HEP provided  Plan Discharge plan remains appropriate    Co-evaluation                 End of Session Equipment Utilized During Treatment: Other (comment) (sling)   Activity Tolerance Patient limited by pain   Patient Left in chair;with call bell/phone within reach   Nurse Communication Mobility status;Weight bearing status;Precautions        Time: [id number] OT Time Calculation (min): 13 min  Charges: OT General Charges $OT Visit: 1 Procedure OT Evaluation $OT Eval Moderate Complexity: 1 Procedure OT Treatments $Self Care/Home Management : 8-22 mins  Redmond Baseman, OTR/L Pager: [phone] 01/23/2016, 11:34 AM

## 2016-01-23 NOTE — Progress Notes (Signed)
    Subjective: 1 Day Post-Op Procedure(s) (LRB): LEFT REVERSE TOTAL SHOULDER ARTHROPLASTY (Left) Patient reports pain as 5 on 0-10 scale.   Denies CP or SOB.  Voiding without difficulty. Positive flatus. Objective: Vital signs in last 24 hours: Temp:  [97.5 F (36.4 C)-99.3 F (37.4 C)] 98.6 F (37 C) (02/04 0404) Pulse Rate:  [54-80] 68 (02/04 0404) Resp:  [0-18] 16 (02/04 0404) BP: (104-152)/(53-67) 127/60 mmHg (02/04 0404) SpO2:  [95 %-100 %] 96 % (02/04 0404) Weight:  [99.791 kg (220 lb)] 99.791 kg (220 lb) (02/03 0901)  Intake/Output from previous day: 02/03 0701 - 02/04 0700 In: 3640 [P.O.:2440; I.V.:1200] Out: 150 [Blood:150] Intake/Output this shift:    Labs:  Recent Labs  01/21/16 1300 01/23/16 0525  HGB 14.1 12.0    Recent Labs  01/21/16 1300 01/23/16 0525  WBC 8.4  --   RBC 4.93  --   HCT 41.6 35.4*  PLT 221  --     Recent Labs  01/21/16 1300 01/23/16 0525  NA 135 133*  K 3.3* 3.5  CL 98* 98*  CO2 27 26  BUN 7 8  CREATININE 0.63 0.59  GLUCOSE 137* 133*  CALCIUM 9.2 8.5*   No results for input(s): LABPT, INR in the last 72 hours.  Physical Exam: Neurologically intact ABD soft Intact pulses distally Incision: dressing C/D/I Compartment soft  Assessment/Plan: 1 Day Post-Op Procedure(s) (LRB): LEFT REVERSE TOTAL SHOULDER ARTHROPLASTY (Left) Advance diet Up with therapy Plan for discharge tomorrow due to pain.  Melina Schools D for Dr. Melina Schools [facility] [phone] 01/23/2016, 8:36 AM

## 2016-01-23 NOTE — Progress Notes (Signed)
Occupational Therapy Evaluation Patient Details Name: Elizabeth Lloyd MRN: [id number] DOB: 09/06/1941 Today's Date: 01/23/2016    History of Present Illness 75 y.o. female s/p L reverse total shoulder arthroplasty. PMH significant for HTN, HLD, GERD, DM, arthritis, breast cancer, bilateral hand surgery, and L knee surgery.   Clinical Impression   PTA, pt was independent with all ADLs and mobility and worked as a Regulatory affairs officer. Pt currently requires min guard assist for mobility and min assist for ADLs. Reviewed sling wear protocol, positioning for sleep, pain/edema management with ice, and compensatory strategies for dressing and bathing. Pt will benefit from continued acute OT to increase independence and safety with ADLs and functional mobility to allow for safe discharge home with assistance from daughter and other family members. Will continue to follow acutely to educate/practice HEP for LUE.    Follow Up Recommendations  Other (comment) (Progress to Outpatient OT at MD discretion)    Equipment Recommendations  None recommended by OT    Recommendations for Other Services       Precautions / Restrictions Precautions Precautions: Shoulder Type of Shoulder Precautions: Active protocol Shoulder Interventions: Shoulder sling/immobilizer;For comfort (and sleep) Precaution Booklet Issued: Yes (comment) Precaution Comments: A/AROM shoulder FF 0-90, ABD 0-60, ER 0-30; AROM elbow, wrist, hand to tolerance Required Braces or Orthoses: Sling Restrictions Weight Bearing Restrictions: Yes LUE Weight Bearing: Non weight bearing      Mobility Bed Mobility Overal bed mobility: Needs Assistance Bed Mobility: Supine to Sit     Supine to sit: Min assist     General bed mobility comments: HOB flat, no use of bedrails and exited on L side of bed to simulate home environment. Pt required min assist for trunk support and reported that her daughter will probably be able to move her bed so  she can exit on the R side.  Transfers Overall transfer level: Needs assistance Equipment used: None Transfers: Sit to/from Stand Sit to Stand: Min guard         General transfer comment: Min guard for safety due to pt reporting dizziness upon sitting up. Initially pt taking small shuffling steps and grabbing onto objects nearby for support - pt reported she always does this immediately after surgery due to dear of falling.    Balance Overall balance assessment: Needs assistance Sitting-balance support: No upper extremity supported;Feet supported Sitting balance-Leahy Scale: Good     Standing balance support: No upper extremity supported;During functional activity Standing balance-Leahy Scale: Good                              ADL Overall ADL's : Needs assistance/impaired     Grooming: Wash/dry hands;Supervision/safety;Standing                   Toilet Transfer: Min guard;Ambulation;Comfort height toilet;Grab bars   Toileting- Clothing Manipulation and Hygiene: Min guard;Sit to/from stand       Functional mobility during ADLs: Min guard General ADL Comments: Reviewed sling wear protocol, pillow placement in bed and recliner, compensatory strategies for dressing and bathing, and pain/edema management with ice.     Vision Vision Assessment?: No apparent visual deficits   Perception     Praxis      Pertinent Vitals/Pain Pain Assessment: 0-10 Pain Score: 7  Pain Location: L shoulder Pain Descriptors / Indicators: Aching Pain Intervention(s): Limited activity within patient's tolerance;Monitored during session;Premedicated before session;Repositioned;Ice applied     Hand Dominance Right  Extremity/Trunk Assessment Upper Extremity Assessment Upper Extremity Assessment: LUE deficits/detail LUE Deficits / Details: decreased AROM and strength as expected post op LUE: Unable to fully assess due to immobilization;Unable to fully assess due to  pain   Lower Extremity Assessment Lower Extremity Assessment: Overall WFL for tasks assessed   Cervical / Trunk Assessment Cervical / Trunk Assessment: Normal   Communication Communication Communication: No difficulties   Cognition Arousal/Alertness: Awake/alert Behavior During Therapy: WFL for tasks assessed/performed Overall Cognitive Status: Within Functional Limits for tasks assessed                     General Comments       Exercises       Shoulder Instructions      Home Living Family/patient expects to be discharged to:: Private residence Living Arrangements: Children Available Help at Discharge: Family;Available 24 hours/day Type of Home: House Home Access: Stairs to enter [employer] of Steps: 2 Entrance Stairs-Rails: None Home Layout: One level     Bathroom Shower/Tub: Walk-in Hydrologist: Handicapped height     Home Equipment: None          Prior Functioning/Environment Level of Independence: Independent        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, drives    OT Diagnosis: Acute pain   OT Problem List: Decreased strength;Decreased range of motion;Decreased activity tolerance;Impaired balance (sitting and/or standing);Decreased coordination;Decreased safety awareness;Decreased knowledge of precautions;Decreased knowledge of use of DME or AE;Obesity;Pain;Impaired UE functional use   OT Treatment/Interventions: Self-care/ADL training;Therapeutic exercise;Energy conservation;DME and/or AE instruction;Therapeutic activities;Patient/family education;Balance training    OT Goals(Current goals can be found in the care plan section) Acute Rehab OT Goals Patient Stated Goal: to get back to work OT Goal Formulation: With patient Time For Goal Achievement: 02/06/16 Potential to Achieve Goals: Good ADL Goals Pt Will Perform Grooming: with modified independence;standing Pt Will Perform Upper Body Dressing:  with modified independence;standing Pt Will Perform Lower Body Dressing: with modified independence;sit to/from stand Pt Will Transfer to Toilet: with modified independence;ambulating;regular height toilet Pt Will Perform Toileting - Clothing Manipulation and hygiene: with modified independence;sit to/from stand Pt Will Perform Tub/Shower Transfer: with modified independence;ambulating Pt/caregiver will Perform Home Exercise Program: Increased ROM;Left upper extremity;Independently;With written HEP provided  OT Frequency: Min 2X/week   Barriers to D/C:            Co-evaluation              End of Session Equipment Utilized During Treatment: Gait belt;Other (comment) (sling) Nurse Communication: Mobility status;Weight bearing status;Precautions  Activity Tolerance: Patient tolerated treatment well Patient left: in chair;with call bell/phone within reach   Time: [id number] OT Time Calculation (min): 25 min Charges:  OT General Charges $OT Visit: 1 Procedure OT Evaluation $OT Eval Moderate Complexity: 1 Procedure OT Treatments $Self Care/Home Management : 8-22 mins G-Codes:    Redmond Baseman, OTR/L Pager: [phone] 01/23/2016, 9:37 AM

## 2016-01-24 DIAGNOSIS — M19012 Primary osteoarthritis, left shoulder: Secondary | ICD-10-CM | POA: Diagnosis present

## 2016-01-24 NOTE — Progress Notes (Signed)
Patient discharged home with daughter and son-in-law. Discharge instructions reviewed with patient and daughter. All questions addressed. Vital signs obtained. Stable. Pain 4/10. Patient wheeled in wheelchair to private vehicle.

## 2016-01-24 NOTE — Progress Notes (Signed)
Occupational Therapy Treatment/Discharge Patient Details Name: Elizabeth Lloyd MRN: [id number] DOB: 04/20/1941 Today's Date: 01/24/2016    History of present illness 75 y.o. female s/p L reverse total shoulder arthroplasty. PMH significant for HTN, HLD, GERD, DM, arthritis, breast cancer, bilateral hand surgery, and L knee surgery.   OT comments  Pt progressing well towards occupational therapy goals. Pt completed dressing tasks with set up assist and demonstrated good understanding of compensatory strategies and shoulder precautions. Pt completed all LUE exercises with supervision and demonstrated increased ROM (~5 degrees) in each direction of movement. Pt still grabbing onto objects in room when ambulating for stability and continues to attribute this to dizziness occurring with position changes. Advised pt to use AD for safety if feeling unsteady or dizzy at home. All education has been completed and pt has no further questions. Pt has no further acute OT needs. OT signing off.     Follow Up Recommendations  Other (comment) (Progress to Outpatient OT at MD discretion)    Equipment Recommendations  None recommended by OT    Recommendations for Other Services      Precautions / Restrictions Precautions Precautions: Shoulder Type of Shoulder Precautions: Active protocol Shoulder Interventions: Shoulder sling/immobilizer;For comfort Precaution Booklet Issued: Yes (comment) Precaution Comments: A/AROM shoulder FF 0-90, ABD 0-60, ER 0-30; AROM elbow, wrist, hand to tolerance Required Braces or Orthoses: Sling Restrictions Weight Bearing Restrictions: Yes LUE Weight Bearing: Non weight bearing       Mobility Bed Mobility Overal bed mobility: Needs Assistance Bed Mobility: Supine to Sit     Supine to sit: Supervision;HOB elevated     General bed mobility comments: Pt asked for HOB not to be flattened due to icnreased dizziness upon sitting up. Pt used bedrails and was  instructed to hold onto edge of mattress at home  Transfers Overall transfer level: Needs assistance Equipment used: None Transfers: Sit to/from Stand Sit to Stand: Min guard         General transfer comment: Min guard for safety. Pt still attempting to "furniture walk" in room and attributes this to dizziness with position changes that subsides after a few moments.     Balance Overall balance assessment: Needs assistance Sitting-balance support: No upper extremity supported;Feet supported Sitting balance-Leahy Scale: Good     Standing balance support: No upper extremity supported;During functional activity Standing balance-Leahy Scale: Fair Standing balance comment: Continues to grab onto furniture in room to walk - due to dizziness with position changes                   ADL Overall ADL's : Needs assistance/impaired     Grooming: Wash/dry hands;Supervision/safety;Standing           Upper Body Dressing : Set up;Sitting   Lower Body Dressing: Set up;Sit to/from stand   Toilet Transfer: Min guard;Ambulation;Comfort height toilet;Grab bars   Toileting- Clothing Manipulation and Hygiene: Min guard;Sit to/from stand   Tub/ Shower Transfer: Walk-in shower;Min guard;Ambulation   Functional mobility during ADLs: Min guard General ADL Comments: Practiced donning/doff sling and clothing and completed all UE ROM exercises. Pt with good return demonstration of compensatory strategies for UE ADLs. Reviewed handout with all precautions, compensatory strategies, and fall prevention strategies.      Vision                     Perception     Praxis      Cognition   Behavior During Therapy: [facility] for tasks  assessed/performed Overall Cognitive Status: Within Functional Limits for tasks assessed                       Extremity/Trunk Assessment               Exercises Shoulder Exercises Shoulder Flexion: AAROM;Left;10 reps;Seated (0-90,  3x/day) Shoulder ABduction: AAROM;Left;10 reps;Seated (0-60, 3x/day) Shoulder External Rotation: AAROM;Left;10 reps;Seated (0-30, 3x/day) Elbow Flexion: AROM;Left;10 reps;Seated (as much as tolerated) Elbow Extension: AROM;10 reps;Seated (as much as tolerated) Wrist Flexion: AROM;Left;10 reps;Seated (as much as tolerated) Wrist Extension: AROM;Left;10 reps;Seated (as much as tolerated) Digit Composite Flexion: AROM;Left;10 reps;Seated (as much as tolerated) Composite Extension: AROM;Left;10 reps;Seated (as much as tolerated) Donning/doffing shirt without moving shoulder: Supervision/safety Method for sponge bathing under operated UE: Supervision/safety Donning/doffing sling/immobilizer: Supervision/safety Correct positioning of sling/immobilizer: Supervision/safety ROM for elbow, wrist and digits of operated UE: Supervision/safety Sling wearing schedule (on at all times/off for ADL's): Supervision/safety Proper positioning of operated UE when showering: Supervision/safety Positioning of UE while sleeping: Supervision/safety   Shoulder Instructions Shoulder Instructions Donning/doffing shirt without moving shoulder: Supervision/safety Method for sponge bathing under operated UE: Supervision/safety Donning/doffing sling/immobilizer: Supervision/safety Correct positioning of sling/immobilizer: Supervision/safety ROM for elbow, wrist and digits of operated UE: Supervision/safety Sling wearing schedule (on at all times/off for ADL's): Supervision/safety Proper positioning of operated UE when showering: Supervision/safety Positioning of UE while sleeping: Supervision/safety     General Comments      Pertinent Vitals/ Pain       Pain Assessment: 0-10 Pain Score: 2  Pain Location: L shoulder Pain Descriptors / Indicators: Aching Pain Intervention(s): Limited activity within patient's tolerance;Monitored during session;Premedicated before session;Repositioned;Ice applied  Home Living                                           Prior Functioning/Environment              Frequency       Progress Toward Goals  OT Goals(current goals can now be found in the care plan section)  Progress towards OT goals: Goals met/education completed, patient discharged from OT  Acute Rehab OT Goals Patient Stated Goal: to get back to work OT Goal Formulation: With patient Time For Goal Achievement: 02/06/16 Potential to Achieve Goals: Good ADL Goals Pt Will Perform Grooming: with modified independence;standing Pt Will Perform Upper Body Dressing: with modified independence;standing Pt Will Perform Lower Body Dressing: with modified independence;sit to/from stand Pt Will Transfer to Toilet: with modified independence;ambulating;regular height toilet Pt Will Perform Toileting - Clothing Manipulation and hygiene: with modified independence;sit to/from stand Pt Will Perform Tub/Shower Transfer: with modified independence;ambulating Pt/caregiver will Perform Home Exercise Program: Increased ROM;Left upper extremity;Independently;With written HEP provided  Plan All goals met and education completed, patient discharged from OT services    Co-evaluation                 End of Session Equipment Utilized During Treatment: Other (comment) (sling)   Activity Tolerance Patient tolerated treatment well   Patient Left in chair;with call bell/phone within reach   Nurse Communication Mobility status        Time: 5374-8270 OT Time Calculation (min): 22 min  Charges: OT General Charges $OT Visit: 1 Procedure OT Treatments $Self Care/Home Management : 8-22 mins  Redmond Baseman, OTR/L Pager: [phone] 01/24/2016, 11:08 AM

## 2016-01-24 NOTE — Progress Notes (Signed)
Subjective: 2 Days Post-Op Procedure(s) (LRB): LEFT REVERSE TOTAL SHOULDER ARTHROPLASTY (Left) Patient reports pain as mild.  Eager to go home.  TOlerating regular diet.  Doing well with therapy.   Objective: Vital signs in last 24 hours: Temp:  [98.1 F (36.7 C)-98.6 F (37 C)] 98.1 F (36.7 C) (02/05 0541) Pulse Rate:  [64-71] 64 (02/05 0541) Resp:  [16-18] 16 (02/05 0541) BP: (105-124)/(52-62) 105/55 mmHg (02/05 0541) SpO2:  [94 %-100 %] 95 % (02/05 0541)  Intake/Output from previous day: 02/04 0701 - 02/05 0700 In: 730 [P.O.:730] Out: -  Intake/Output this shift: Total I/O In: 480 [P.O.:480] Out: -    Recent Labs  01/21/16 1300 01/23/16 0525  HGB 14.1 12.0    Recent Labs  01/21/16 1300 01/23/16 0525  WBC 8.4  --   RBC 4.93  --   HCT 41.6 35.4*  PLT 221  --     Recent Labs  01/21/16 1300 01/23/16 0525  NA 135 133*  K 3.3* 3.5  CL 98* 98*  CO2 27 26  BUN 7 8  CREATININE 0.63 0.59  GLUCOSE 137* 133*  CALCIUM 9.2 8.5*   No results for input(s): LABPT, INR in the last 72 hours.  PE:  L Shoulder dressed adnd ry.  NVI at L UE.  Assessment/Plan: 2 Days Post-Op Procedure(s) (LRB): LEFT REVERSE TOTAL SHOULDER ARTHROPLASTY (Left) D/c home.  Bell Carbo 01/24/2016, 10:01 AM

## 2016-01-26 ENCOUNTER — Encounter (HOSPITAL_COMMUNITY): Payer: Self-pay | Admitting: Orthopedic Surgery

## 2016-01-26 NOTE — Discharge Summary (Signed)
Physician Discharge Summary   Patient ID: Elizabeth Lloyd MRN: [id number] DOB/AGE: Jan 23, 1941 75 y.o.  Admit date: 01/22/2016 Discharge date: 01/24/16  Admission Diagnoses:  Active Problems:   S/P shoulder replacement   Arthritis of left shoulder region   Discharge Diagnoses:  Same   Surgeries: Procedure(s): LEFT REVERSE TOTAL SHOULDER ARTHROPLASTY on 01/22/2016   Consultants: PT/OT  Discharged Condition: Stable  Hospital Course: Elizabeth Lloyd is an 75 y.o. female who was admitted 01/22/2016 with a chief complaint of No chief complaint on file. , and found to have a diagnosis of <principal problem not specified>.  They were brought to the operating room on 01/22/2016 and underwent the above named procedures.    The patient had an uncomplicated hospital course and was stable for discharge.  Recent vital signs:  Filed Vitals:   01/24/16 0541 01/24/16 1100  BP: 105/55 114/62  Pulse: 64 69  Temp: 98.1 F (36.7 C) 98.1 F (36.7 C)  Resp: 16 16    Recent laboratory studies:  Results for orders placed or performed during the hospital encounter of 01/22/16  Glucose, capillary  Result Value Ref Range   Glucose-Capillary 106 (H) 65 - 99 mg/dL  Glucose, capillary  Result Value Ref Range   Glucose-Capillary 145 (H) 65 - 99 mg/dL   Comment 1 Notify RN   Glucose, capillary  Result Value Ref Range   Glucose-Capillary 110 (H) 65 - 99 mg/dL  Hemoglobin and hematocrit, blood  Result Value Ref Range   Hemoglobin 12.0 12.0 - 15.0 g/dL   HCT 35.4 (L) 36.0 - [id number] %  Basic metabolic panel  Result Value Ref Range   Sodium 133 (L) 135 - 145 mmol/L   Potassium 3.5 3.5 - 5.1 mmol/L   Chloride 98 (L) 101 - 111 mmol/L   CO2 26 22 - 32 mmol/L   Glucose, Bld 133 (H) 65 - 99 mg/dL   BUN 8 6 - 20 mg/dL   Creatinine, Ser 0.59 0.44 - 1.00 mg/dL   Calcium 8.5 (L) 8.9 - 10.3 mg/dL   GFR calc non Af Amer >60 >60 mL/min   GFR calc Af Amer >60 >60 mL/min   Anion gap 9 5 - 15     Discharge Medications:     Medication List    TAKE these medications        albuterol 108 (90 Base) MCG/ACT inhaler  Commonly known as:  PROVENTIL HFA;VENTOLIN HFA  Inhale 2 puffs into the lungs every 6 (six) hours as needed.     alendronate 70 MG tablet  Commonly known as:  FOSAMAX  Take 1 tablet (70 mg total) by mouth once a week. Take with a full glass of water on an empty stomach.     ALIGN PO  Take 1 tablet by mouth daily.     anastrozole 1 MG tablet  Commonly known as:  ARIMIDEX  TAKE ONE TABLET BY MOUTH ONCE DAILY     aspirin 81 MG tablet  Take 81 mg by mouth daily.     atorvastatin 80 MG tablet  Commonly known as:  LIPITOR  Take 80 mg by mouth daily.     calcium carbonate 600 MG Tabs tablet  Commonly known as:  OS-CAL  Take 600 mg by mouth 2 (two) times daily with a meal.     CHOLEST OFF PO  Take 1 tablet by mouth daily.     Cranberry 500 MG Caps  Take 500 mg by mouth every other day.  docusate sodium 100 MG capsule  Commonly known as:  COLACE  Take 100 mg by mouth 4 (four) times daily.     ergocalciferol 50000 units capsule  Commonly known as:  VITAMIN D2  Take 50,000 Units by mouth once a week.     fish oil-omega-3 fatty acids 1000 MG capsule  Take 1,000 g by mouth daily.     GLUCOSAMINE CHONDR 1500 COMPLX PO  Take 3,000 mg by mouth daily.     HYDROcodone-acetaminophen 5-325 MG tablet  Commonly known as:  NORCO  Take 1-2 tablets by mouth every 6 (six) hours as needed for moderate pain.     losartan-hydrochlorothiazide 50-12.5 MG tablet  Commonly known as:  HYZAAR  Take 1 tablet by mouth daily.     methocarbamol 500 MG tablet  Commonly known as:  ROBAXIN  Take 1 tablet (500 mg total) by mouth 3 (three) times daily as needed.     omeprazole 40 MG capsule  Commonly known as:  PRILOSEC  Take 40 mg by mouth daily.     polyethylene glycol packet  Commonly known as:  MIRALAX / GLYCOLAX  Take 17 g by mouth daily.     Psyllium 48.57 %  Powd  Take 10 mLs by mouth daily.        Diagnostic Studies: Dg Shoulder Left Port  01/22/2016  CLINICAL DATA:  Left shoulder replacement EXAM: LEFT SHOULDER - 1 VIEW COMPARISON:  None. FINDINGS: Left total shoulder arthroplasty is anatomically aligned. No breakage or loosening of the hardware. No fracture. IMPRESSION: Left total shoulder arthroplasty anatomically aligned. Electronically Signed   By: Marybelle Killings M.D.   On: 01/22/2016 14:32    Disposition: 01-Home or Self Care      Discharge Instructions    Call MD / Call 911    Complete by:  As directed   If you experience chest pain or shortness of breath, CALL 911 and be transported to the hospital emergency room.  If you develope a fever above 101 F, pus (white drainage) or increased drainage or redness at the wound, or calf pain, call your surgeon's office.     Constipation Prevention    Complete by:  As directed   Drink plenty of fluids.  Prune juice may be helpful.  You may use a stool softener, such as Colace (over the counter) 100 mg twice a day.  Use MiraLax (over the counter) for constipation as needed.     Diet - low sodium heart healthy    Complete by:  As directed      Increase activity slowly as tolerated    Complete by:  As directed            Follow-up Information    Follow up with NORRIS,STEVEN R, MD. Call in 2 weeks.   Specialty:  Orthopedic Surgery   Why:  [phone]   Contact information:   [address] [place] [address] [id number]        Signed: Ventura Bruns 01/26/2016, 7:29 AM

## 2016-02-03 ENCOUNTER — Other Ambulatory Visit: Payer: Self-pay | Admitting: Oncology

## 2016-02-03 NOTE — Telephone Encounter (Signed)
Chart reviewed.

## 2016-05-21 ENCOUNTER — Other Ambulatory Visit: Payer: Self-pay | Admitting: Oncology

## 2016-06-28 ENCOUNTER — Other Ambulatory Visit: Payer: Self-pay | Admitting: Family Medicine

## 2016-06-28 DIAGNOSIS — Z853 Personal history of malignant neoplasm of breast: Secondary | ICD-10-CM

## 2016-07-08 ENCOUNTER — Telehealth: Payer: Self-pay | Admitting: Oncology

## 2016-07-08 NOTE — Telephone Encounter (Signed)
Called patient to confirm appointment. Left voice message. Appointment letter and schedule mailed. Maria F. °

## 2016-07-22 ENCOUNTER — Ambulatory Visit
Admission: RE | Admit: 2016-07-22 | Discharge: 2016-07-22 | Disposition: A | Discharge status: Home / Self Care | Payer: Medicare Other | Source: Ambulatory Visit | Attending: Family Medicine | Admitting: Family Medicine

## 2016-07-22 DIAGNOSIS — Z853 Personal history of malignant neoplasm of breast: Secondary | ICD-10-CM

## 2016-08-15 ENCOUNTER — Other Ambulatory Visit: Payer: Self-pay | Admitting: Oncology

## 2016-08-15 NOTE — Telephone Encounter (Signed)
Chart reviewed.

## 2016-09-03 NOTE — Progress Notes (Signed)
[facility] [address], [place] [address] Phone: [phone] Subjective:    I'm seeing this patient by the request  of:  GATES,DONNA RUTH, MD   CC: Right wrist pain  [id number]  Elizabeth Lloyd is a 75 y.o. female coming in with complaint of right wrist pain. Patient is had this pain for quite some time. Seems to be worsening. Since then patient does do a lot of sewing. When patient tries to sew it seems to get worse. Patient has no some mild weakness. Seems to be localized around the thumb. Side primary care provider and was diagnosed with more of a tendinitis. Patient denies any swelling, does not remember any injury. He is having some mild associated numbness with the thumb as well.     Past Medical History:  Diagnosis Date  . Arthritis   . Breast cancer ([place])   . Diabetes mellitus    diet controlled  . Full dentures   . GERD (gastroesophageal reflux disease)   . H/O bone density study 2005  . H/O colonoscopy 06/18/10  . Hernia   . History of bronchitis   . History of hiatal hernia   . Hyperlipidemia   . Hypertension   . Urinary incontinence   . Wears glasses    Past Surgical History:  Procedure Laterality Date  . ABDOMINAL HYSTERECTOMY    . CHOLECYSTECTOMY    . COLONOSCOPY    . ESOPHAGOGASTRODUODENOSCOPY    . HAND SURGERY     bilateral  . knee surgery     left  . lt breas lumpectomy  07/24/12  . REVERSE SHOULDER ARTHROPLASTY Left 01/22/2016   Procedure: LEFT REVERSE TOTAL SHOULDER ARTHROPLASTY;  Surgeon: Netta Cedars, MD;  Location: [facility];  Service: Orthopedics;  Laterality: Left;  . SHOULDER SURGERY     Bilateral   Social History   Social History  . Marital status: Widowed    Spouse name: N/A  . Number of children: N/A  . Years of education: N/A   Social History Main Topics  . Smoking status: Never Smoker  . Smokeless tobacco: Never Used  . Alcohol use No  . Drug use: No  . Sexual activity: Not on file    Other Topics Concern  . Not on file   Social History Narrative  . No narrative on file   Allergies  Allergen Reactions  . Pertussis Vaccines Rash   Family History  Problem Relation Age of Onset  . Breast cancer Mother   . Colon cancer Brother     Past medical history, social, surgical and family history all reviewed in electronic medical record.  No pertanent information unless stated regarding to the chief complaint.   Review of Systems: No headache, visual changes, nausea, vomiting, diarrhea, constipation, dizziness, abdominal pain, skin rash, fevers, chills, night sweats, weight loss, swollen lymph nodes, body aches, joint swelling, muscle aches, chest pain, shortness of breath, mood changes.   Objective  There were no vitals taken for this visit.  General: No apparent distress alert and oriented x3 mood and affect normal, dressed appropriately.  HEENT: Pupils equal, extraocular movements intact  Respiratory: Patient's speak in full sentences and does not appear short of breath  Cardiovascular: No lower extremity edema, non tender, no erythema  Skin: Warm dry intact with no signs of infection or rash on extremities or on axial skeleton.  Abdomen: Soft nontender  Neuro: Cranial nerves II through XII are intact, neurovascularly intact in all  extremities with 2+ DTRs and 2+ pulses.  Lymph: No lymphadenopathy of posterior or anterior cervical chain or axillae bilaterally.  Gait normal with good balance and coordination.  MSK:  Non tender with full range of motion and good stability and symmetric strength and tone of shoulders, elbows,  hip, knee and ankles bilaterally. Arthritic changes of multiple joints patient does have a shoulder replacement on the left side. Wrist: Right Inspection normal with no visible erythema or swelling. ROM smooth and normal with good flexion and extension and ulnar/radial deviation that is symmetrical with opposite wrist. Mild tenderness over the  abductor pollicis longus. No snuffbox tenderness. No tenderness over Canal of Gu[place]. Strength 5/5 in all directions without pain. Positive Finkelstein, negative tinel's and phalens. Negative Watson's test.  MSK US performed of: right  This study was ordered, performed, and interpreted by Charlann Boxer D.O.  Wrist: + abductor effusion in the tendon sheath.  No effusion seen. TFCC intact. Scapholunate ligament intact. Carpal tunnel visualized and median nerve area normal, flexor tendons all normal in appearance without fraying, tears, or sheath effusions. Power doppler signal normal.  IMPRESSION:  De quervain tenosynovitis.   Procedure: Real-time Ultrasound Guided Injection of right Abductor pollicis longs tendon sheath Device: GE Logiq E  Ultrasound guided injection is preferred based studies that show increased duration, increased effect, greater accuracy, decreased procedural pain, increased response rate with ultrasound guided versus blind injection.  Verbal informed consent obtained.  Time-out conducted.  Noted no overlying erythema, induration, or other signs of local infection.  Skin prepped in a sterile fashion.  Local anesthesia: Topical Ethyl chloride.  With sterile technique and under real time ultrasound guidance:  tendon visualized.  23g 5/8 inch needle inserted distal to proximal approach into tendon sheath. Pictures taken  for needle placement. Patient did have injection of 2 cc of 1% lidocaine, 1 cc of 0.5% Marcaine, and 0.5 cc of Kenalog 40 mg/dL. Completed without difficulty  Pain immediately resolved suggesting accurate placement of the medication.  Advised to call if fevers/chills, erythema, induration, drainage, or persistent bleeding.  Images permanently stored and available for review in the ultrasound unit.  Impression: Technically successful ultrasound guided injection.  Procedure note [id number]; 15 minutes spent for Therapeutic exercises as stated in above notes.   This included exercises focusing on stretching, strengthening, with significant focus on eccentric aspects. Flexion and extension exercises with abductor as well.   Proper technique shown and discussed handout in great detail with ATC.  All questions were discussed and answered.     Impression and Recommendations:     This case required medical decision making of moderate complexity.      Note: This dictation was prepared with Dragon dictation along with smaller phrase technology. Any transcriptional errors that result from this process are unintentional.

## 2016-09-05 ENCOUNTER — Ambulatory Visit (INDEPENDENT_AMBULATORY_CARE_PROVIDER_SITE_OTHER): Payer: Medicare Other | Admitting: Family Medicine

## 2016-09-05 ENCOUNTER — Other Ambulatory Visit: Payer: Self-pay

## 2016-09-05 VITALS — BP 122/72 | HR 72 | Wt 217.2 lb

## 2016-09-05 DIAGNOSIS — M25531 Pain in right wrist: Secondary | ICD-10-CM

## 2016-09-05 DIAGNOSIS — M654 Radial styloid tenosynovitis [de Quervain]: Secondary | ICD-10-CM

## 2016-09-05 NOTE — Patient Instructions (Signed)
Good to meet you  Ice 20 minutes 2 times daily. Usually after activity and before bed. Exercises 3 times a week.  pennsaid pinkie amount topically 2 times daily as needed.  Wear wrist brace day and night for 1 week then nightly for 2 weeks.  Avoid heavy lifting.  See me again in 4 weeks if not improved

## 2016-09-05 NOTE — Assessment & Plan Note (Signed)
Injected today given brace HEP and topical NSAIDs given.  Discussed avoiding certain activities.  RTC in 4 weeks.

## 2016-09-15 ENCOUNTER — Ambulatory Visit (HOSPITAL_BASED_OUTPATIENT_CLINIC_OR_DEPARTMENT_OTHER): Payer: Medicare Other | Admitting: Oncology

## 2016-09-15 ENCOUNTER — Other Ambulatory Visit: Payer: Self-pay | Admitting: *Deleted

## 2016-09-15 ENCOUNTER — Other Ambulatory Visit (HOSPITAL_BASED_OUTPATIENT_CLINIC_OR_DEPARTMENT_OTHER): Payer: Medicare Other

## 2016-09-15 VITALS — BP 137/63 | HR 57 | Temp 98.2°F | Resp 18 | Ht 62.0 in | Wt 217.7 lb

## 2016-09-15 DIAGNOSIS — C50412 Malignant neoplasm of upper-outer quadrant of left female breast: Secondary | ICD-10-CM

## 2016-09-15 LAB — CBC WITH DIFFERENTIAL/PLATELET
BASO%: 0.5 % (ref 0.0–2.0)
Basophils Absolute: 0 10*3/uL (ref 0.0–0.1)
EOS ABS: 0.2 10*3/uL (ref 0.0–0.5)
EOS%: 2.3 % (ref 0.0–7.0)
HCT: 42.3 % (ref 34.8–46.6)
HEMOGLOBIN: 14 g/dL (ref 11.6–15.9)
LYMPH%: 20.8 % (ref 14.0–49.7)
MCH: 27.9 pg (ref 25.1–34.0)
MCHC: 33.1 g/dL (ref 31.5–36.0)
MCV: 84.3 fL (ref 79.5–101.0)
MONO#: 0.6 10*3/uL (ref 0.1–0.9)
MONO%: 7.1 % (ref 0.0–14.0)
NEUT%: 69.3 % (ref 38.4–76.8)
NEUTROS ABS: 5.5 10*3/uL (ref 1.5–6.5)
Platelets: 205 10*3/uL (ref 145–400)
RBC: 5.02 10*6/uL (ref 3.70–5.45)
RDW: 13.9 % (ref 11.2–14.5)
WBC: 8 10*3/uL (ref 3.9–10.3)
lymph#: 1.7 10*3/uL (ref 0.9–3.3)

## 2016-09-15 LAB — COMPREHENSIVE METABOLIC PANEL
ALBUMIN: 3.4 g/dL — AB (ref 3.5–5.0)
ALK PHOS: 100 U/L (ref 40–150)
ALT: 24 U/L (ref 0–55)
AST: 23 U/L (ref 5–34)
Anion Gap: 9 mEq/L (ref 3–11)
BILIRUBIN TOTAL: 0.61 mg/dL (ref 0.20–1.20)
BUN: 9.5 mg/dL (ref 7.0–26.0)
CO2: 27 mEq/L (ref 22–29)
CREATININE: 0.8 mg/dL (ref 0.6–1.1)
Calcium: 9.6 mg/dL (ref 8.4–10.4)
Chloride: 103 mEq/L (ref 98–109)
EGFR: 78 mL/min/{1.73_m2} — AB (ref >90)
GLUCOSE: 108 mg/dL (ref 70–140)
Potassium: 4.2 mEq/L (ref 3.5–5.1)
SODIUM: 139 meq/L (ref 136–145)
TOTAL PROTEIN: 7 g/dL (ref 6.4–8.3)

## 2016-09-15 MED ORDER — ANASTROZOLE 1 MG PO TABS: 1.0000 mg | ORAL_TABLET | Freq: Every day | ORAL | 2 refills | Status: DC

## 2016-09-15 NOTE — Progress Notes (Signed)
ID: Calvert Cantor   DOB: [birth year]  MR#: [id number]  QMG#:[id number]  PCP: Marjorie Smolder, MD GYN: SU:  OTHER MD: Jamie Kato  CHIEF COMPLAINT: Estrogen receptor positive breast cancer  CURRENT TREATMENT: Anastrozole  BREAST CANCER HISTORY: From the original intent nodes:  The patient had routine screening mammography 06/24/2011 suggesting a possible abnormality in her left breast. Additional views 07/01/2011 showed no evidence of malignancy. There was no palpable abnormality. There was mild nodularity in the lateral quadrants of the left breast, and six-month followup was suggested. This was performed 01/20/2012, and showed stability. On 06/27/2012 the patient had bilateral diagnostic mammography, and this time the 9 mm area of focal asymmetric density in the upper outer left breast appeared more irregular than prior. Ultrasound showed no associated abnormality, and the patient proceeded to stereotactic biopsy 06/29/2012. 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   The patient's subsequent history is as detailed below.  INTERVAL HISTORY: Rubyann returns today for follow up of her estrogen receptor positive breast cancer. She continues on anastrozole, with good tolerance. Hot flashes and vaginal dryness are not a major issue. She never developed the arthralgias or myalgias that many patients can experience on this medication. She obtains it at a good price.  REVIEW OF SYSTEMS: Franchesca is doing "pretty good", except for some problems with her right wrist tendons which are being taken care of through Dr. Tamala Julian a detailed review of systems today was otherwise stable   PAST MEDICAL HISTORY: Past Medical History:  Diagnosis Date  . Arthritis   . Breast cancer ([place])   . Diabetes mellitus    diet controlled  . Full dentures   . GERD (gastroesophageal reflux  disease)   . H/O bone density study 2005  . H/O colonoscopy 06/18/10  . Hernia   . History of bronchitis   . History of hiatal hernia   . Hyperlipidemia   . Hypertension   . Urinary incontinence   . Wears glasses     PAST SURGICAL HISTORY: Past Surgical History:  Procedure Laterality Date  . ABDOMINAL HYSTERECTOMY    . CHOLECYSTECTOMY    . COLONOSCOPY    . ESOPHAGOGASTRODUODENOSCOPY    . HAND SURGERY     bilateral  . knee surgery     left  . lt breas lumpectomy  07/24/12  . REVERSE SHOULDER ARTHROPLASTY Left 01/22/2016   Procedure: LEFT REVERSE TOTAL SHOULDER ARTHROPLASTY;  Surgeon: Netta Cedars, MD;  Location: [facility];  Service: Orthopedics;  Laterality: Left;  . SHOULDER SURGERY     Bilateral    FAMILY HISTORY Family History  Problem Relation Age of Onset  . Breast cancer Mother   . Colon cancer Brother    the patient's father died from heart disease at the age of 93. The patient's mother lived to be 16. The patient had 7 brothers and 2 sister. One brother had colon cancer, but she does not know at what age it was diagnosed. The patient's mother was diagnosed with colon cancer at the age of 25 and breast cancer at the same time. There is no other history of breast or ovarian cancer in the family.  GYNECOLOGIC HISTORY: Menarche age 21, "can't remember" when she went through the change of life. She did not take hormone replacement. She is GX P2, first live birth age 43.  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 She is widowed, and lives with her daughter Nonnie Done in [place].  Her daughter Renee Harder is also living in [place]. The patient has 2 grandchildren age 4 and 78 as of July 2013. She is not a Ambulance person.   ADVANCED DIRECTIVES:  in place, separately scanned. She has named her daughter, Nonnie Done as her healthcare power of attorney. Marlowe Kays can be reached at [phone]. The patient also completed and notarized a living will.  HEALTH  MAINTENANCE: Social History  Substance Use Topics  . Smoking status: Never Smoker  . Smokeless tobacco: Never Used  . Alcohol use No     Colonoscopy: 2011/ [facility]  PAP:  Bone density: 2015  Lipid panel:   Allergies  Allergen Reactions  . Pertussis Vaccines Rash    Current Outpatient Prescriptions  Medication Sig Dispense Refill  . albuterol (PROVENTIL HFA;VENTOLIN HFA) 108 (90 BASE) MCG/ACT inhaler Inhale 2 puffs into the lungs every 6 (six) hours as needed.    Marland Kitchen alendronate (FOSAMAX) 70 MG tablet TAKE ONE TABLET BY MOUTH ONCE A WEEK IN THE MORNING WITH A FULL GLASS OF WATER, 30 MINUTES BEFORE A MEAL OR BEVERAGE. REMAIN UPRIGHT 12 tablet 1  . anastrozole (ARIMIDEX) 1 MG tablet Take 1 tablet (1 mg total) by mouth daily. 90 tablet 2  . aspirin 81 MG tablet Take 81 mg by mouth daily.    Marland Kitchen atorvastatin (LIPITOR) 80 MG tablet Take 80 mg by mouth daily.     . calcium carbonate (OS-CAL) 600 MG TABS Take 600 mg by mouth 2 (two) times daily with a meal.    . Cranberry 500 MG CAPS Take 500 mg by mouth every other day.     . docusate sodium (COLACE) 100 MG capsule Take 100 mg by mouth 4 (four) times daily.    . ergocalciferol (VITAMIN D2) 50000 UNITS capsule Take 50,000 Units by mouth once a week.    . fish oil-omega-3 fatty acids 1000 MG capsule Take 1,000 g by mouth daily.    . Glucosamine-Chondroit-Vit C-Mn (GLUCOSAMINE CHONDR 1500 COMPLX PO) Take 3,000 mg by mouth daily.    Marland Kitchen losartan-hydrochlorothiazide (HYZAAR) 50-12.5 MG per tablet Take 1 tablet by mouth daily.    Marland Kitchen omeprazole (PRILOSEC) 40 MG capsule Take 40 mg by mouth daily.    . Plant Sterols and Stanols (CHOLEST OFF PO) Take 1 tablet by mouth daily.    . polyethylene glycol (MIRALAX / GLYCOLAX) packet Take 17 g by mouth daily.    . Probiotic Product (ALIGN PO) Take 1 tablet by mouth daily.    . Psyllium 48.57 % POWD Take 10 mLs by mouth daily.     No current facility-administered medications for this visit.    Objective: Older  white womanWho appears stated age  75:   09/15/16 1011  BP: 137/63  Pulse: (!) 57  Resp: 18  Temp: 98.2 F (36.8 C)      Body mass index is 39.82 kg/m.    ECOG FS: 1 Filed Weights   09/15/16 1011  Weight: 217 lb 11.2 oz (98.7 kg)   Sclerae unicteric, pupils round and equal Oropharynx clear and moist-- no thrush or other lesions No cervical or supraclavicular adenopathy Lungs no rales or rhonchi Heart regular rate and rhythm Abd soft, nontender, positive bowel sounds MSK no focal spinal tenderness, no upper extremity lymphedema Neuro: nonfocal, well oriented, appropriate affect Breasts: The right breast is unremarkable. The left breast is status post lumpectomy. There is no evidence of  local recurrence. Left axilla is benign.   LAB RESULTS: Lab Results  Component Value Date   WBC 8.0 09/15/2016   NEUTROABS 5.5 09/15/2016   HGB 14.0 09/15/2016   HCT 42.3 09/15/2016   MCV 84.3 09/15/2016   PLT 205 09/15/2016      Chemistry      Component Value Date/Time   NA 139 09/15/2016 0933   K 4.2 09/15/2016 0933   CL 98 (L) 01/23/2016 0525   CL 101 03/15/2013 1445   CO2 27 09/15/2016 0933   BUN 9.5 09/15/2016 0933   CREATININE 0.8 09/15/2016 0933      Component Value Date/Time   CALCIUM 9.6 09/15/2016 0933   ALKPHOS 100 09/15/2016 0933   AST 23 09/15/2016 0933   ALT 24 09/15/2016 0933   BILITOT 0.61 09/15/2016 0933       Lab Results  Component Value Date   LABCA2 31 01/24/2013    STUDIES: [place] Extrem Up Right Ltd  Result Date: 09/06/2016 Procedure: Real-time Ultrasound Guided Injection of right Abductor pollicis longs tendon sheath Device: GE Logiq E Ultrasound guided injection is preferred based studies that show increased duration, increased effect, greater accuracy, decreased procedural pain, increased response rate with ultrasound guided versus blind injection. Verbal informed consent obtained. Time-out conducted. Noted no overlying erythema, induration, or  other signs of local infection. Skin prepped in a sterile fashion. Local anesthesia: Topical Ethyl chloride. With sterile technique and under real time ultrasound guidance:  tendon visualized.  23g 5/8 inch needle inserted distal to proximal approach into tendon sheath. Pictures taken  for needle placement. Patient did have injection of 2 cc of 1% lidocaine, 1 cc of 0.5% Marcaine, and 0.5 cc of Kenalog 40 mg/dL. Completed without difficulty Pain immediately resolved suggesting accurate placement of the medication. Advised to call if fevers/chills, erythema, induration, drainage, or persistent bleeding. Images permanently stored and available for review in the ultrasound unit. Impression: Technically successful ultrasound guided injection.  Study Result   CLINICAL DATA:  History of treated left breast cancer, status post lumpectomy in 2013. Patient is asymptomatic.  EXAM: 2D DIGITAL DIAGNOSTIC BILATERAL MAMMOGRAM WITH CAD AND ADJUNCT TOMO  COMPARISON:  Previous exam(s).  ACR Breast Density Category b: There are scattered areas of fibroglandular density.  FINDINGS: There are no suspicious masses, areas of nonsurgical architectural distortion or microcalcifications in either breast. Stable post lumpectomy changes seen in the left breast upper outer quadrant, middle and posterior depth.  Mammographic images were processed with CAD.  IMPRESSION: No mammographic evidence of malignancy in either breast, status post left lumpectomy.  RECOMMENDATION: Diagnostic mammogram is suggested in 1 year. (Code:DM-B-01Y)  I have discussed the findings and recommendations with the patient. Results were also provided in writing at the conclusion of the visit. If applicable, a reminder letter will be sent to the patient regarding the next appointment.  BI-RADS CATEGORY  2: Benign.   Electronically Signed   By: Fidela Salisbury M.D.   On: 07/22/2016 15:00      ASSESSMENT: 75 y.o.  [place] woman,   (1)  status post left breast upper outer quadrant lumpectomy and sentinel lymph node biopsy 07/24/2012 for a pT1c pN0, stage IA invasive ductal carcinoma, grade 1, strongly estrogen and progesterone receptor positive, with an MIB-1 of 13, and no HER-2 amplification.  (2)  started anastrozole in September 2013  (3)  Osteoporosis; received one dose of zoledronic acid in late March 2014, with very poor tolerance, (severe bone pain). This was subsequently discontinued.  (  a) alendronate started April 2016   PLAN:  Maloree s now 4 years out from definitive surgery for her breast cancer with no evidence of disease recurrence. This is very favorable.  She is tolerating the anastrozole well. She is also on alendronate to deal with the bone thinning problems associated with this drug and of course with postmenopausal itself.  She will see me again in one year. That will be her "graduation visit" she knows to call for any problems that may develop before then.    Torian Quintero C    09/15/2016

## 2016-10-02 NOTE — Progress Notes (Signed)
[facility] [place] [place], [place] [address] Phone: [phone] Subjective:      CC: Right wrist pain f/u   [id number]  Elizabeth Lloyd is a 75 y.o. female coming in with complaint of right wrist pain. Was found to have de Quervain's tenosynovitis. This given injection 1 month ago. Patient was to do bracing and conservative therapy. Patient states She is doing 97% better. Patient states in very mild pain with certain movements but nothing severe. Able to do daily activities and sleeping comfortably. Has been doing the exercises regularly as well as continue to wear the brace at night    Past Medical History:  Diagnosis Date  . Arthritis   . Breast cancer ([place])   . Diabetes mellitus    diet controlled  . Full dentures   . GERD (gastroesophageal reflux disease)   . H/O bone density study 2005  . H/O colonoscopy 06/18/10  . Hernia   . History of bronchitis   . History of hiatal hernia   . Hyperlipidemia   . Hypertension   . Urinary incontinence   . Wears glasses    Past Surgical History:  Procedure Laterality Date  . ABDOMINAL HYSTERECTOMY    . CHOLECYSTECTOMY    . COLONOSCOPY    . ESOPHAGOGASTRODUODENOSCOPY    . HAND SURGERY     bilateral  . knee surgery     left  . lt breas lumpectomy  07/24/12  . REVERSE SHOULDER ARTHROPLASTY Left 01/22/2016   Procedure: LEFT REVERSE TOTAL SHOULDER ARTHROPLASTY;  Surgeon: Netta Cedars, MD;  Location: [facility];  Service: Orthopedics;  Laterality: Left;  . SHOULDER SURGERY     Bilateral   Social History   Social History  . Marital status: Widowed    Spouse name: N/A  . Number of children: N/A  . Years of education: N/A   Social History Main Topics  . Smoking status: Never Smoker  . Smokeless tobacco: Never Used  . Alcohol use No  . Drug use: No  . Sexual activity: Not Asked   Other Topics Concern  . None   Social History Narrative  . None   Allergies  Allergen Reactions  .  Pertussis Vaccines Rash   Family History  Problem Relation Age of Onset  . Breast cancer Mother   . Colon cancer Brother     Past medical history, social, surgical and family history all reviewed in electronic medical record.  No pertanent information unless stated regarding to the chief complaint.   Review of Systems: No headache, visual changes, nausea, vomiting, diarrhea, constipation, dizziness, abdominal pain, skin rash, fevers, chills, night sweats, weight loss, swollen lymph nodes, body aches, joint swelling, muscle aches, chest pain, shortness of breath, mood changes.   Objective  Blood pressure 112/64, pulse 85, weight 218 lb (98.9 kg), SpO2 97 %.  General: No apparent distress alert and oriented x3 mood and affect normal, dressed appropriately.  HEENT: Pupils equal, extraocular movements intact  Respiratory: Patient's speak in full sentences and does not appear short of breath  Cardiovascular: No lower extremity edema, non tender, no erythema  Skin: Warm dry intact with no signs of infection or rash on extremities or on axial skeleton.  Abdomen: Soft nontender  Neuro: Cranial nerves II through XII are intact, neurovascularly intact in all extremities with 2+ DTRs and 2+ pulses.  Lymph: No lymphadenopathy of posterior or anterior cervical chain or axillae bilaterally.  Gait normal with good balance and  coordination.  MSK:  Non tender with full range of motion and good stability and symmetric strength and tone of shoulders, elbows,  hip, knee and ankles bilaterally. Arthritic changes of multiple joints patient does have a shoulder replacement on the left side. Wrist: Right Inspection normal with no visible erythema or swelling. ROM smooth and normal with good flexion and extension and ulnar/radial deviation that is symmetrical with opposite wrist. Nontender on exam No snuffbox tenderness. No tenderness over Canal of [place]. Strength 5/5 in all directions without pain. Negative  Finkelstein, negative tinel's and phalens. Negative Watson's test. Contralateral wrist unremarkable  Impression and Recommendations:     This case required medical decision making of moderate complexity.      Note: This dictation was prepared with Dragon dictation along with smaller phrase technology. Any transcriptional errors that result from this process are unintentional.

## 2016-10-03 ENCOUNTER — Ambulatory Visit (INDEPENDENT_AMBULATORY_CARE_PROVIDER_SITE_OTHER): Payer: Medicare Other | Admitting: Family Medicine

## 2016-10-03 ENCOUNTER — Encounter: Payer: Self-pay | Admitting: Family Medicine

## 2016-10-03 DIAGNOSIS — M654 Radial styloid tenosynovitis [de Quervain]: Secondary | ICD-10-CM | POA: Diagnosis not present

## 2016-10-03 NOTE — Assessment & Plan Note (Signed)
Doing well at this time. Encourage her to continue the exercises occasionally. Discussed the icing. Patient will do longer use the brace. Follow-up as needed

## 2016-10-04 ENCOUNTER — Ambulatory Visit (INDEPENDENT_AMBULATORY_CARE_PROVIDER_SITE_OTHER): Payer: Worker's Compensation | Admitting: Physician Assistant

## 2016-10-04 VITALS — BP 130/70 | HR 61 | Temp 97.9°F | Resp 16 | Ht 62.0 in | Wt 219.8 lb

## 2016-10-04 DIAGNOSIS — Z23 Encounter for immunization: Secondary | ICD-10-CM | POA: Diagnosis not present

## 2016-10-04 DIAGNOSIS — S61211A Laceration without foreign body of left index finger without damage to nail, initial encounter: Secondary | ICD-10-CM

## 2016-10-04 NOTE — Patient Instructions (Addendum)
Please keep the wound covered for the next 24 hours.  You will want to keep it covered at work.  Do not get it soiled or with debris.  Wear gloves.  Please do not submerge in water (dishwater or clean).  Please return in 5 days for recheck.    IF you received an x-ray today, you will receive an invoice from [facility]. Please contact [facility] at [phone] with questions or concerns regarding your invoice.   IF you received labwork today, you will receive an invoice from [employer]. Please contact [facility] at [phone] with questions or concerns regarding your invoice.   Our billing staff will not be able to assist you with questions regarding bills from these companies.  You will be contacted with the lab results as soon as they are available. The fastest way to get your results is to activate your My Chart account. Instructions are located on the last page of this paperwork. If you have not heard from [place] regarding the results in 2 weeks, please contact this office.

## 2016-10-04 NOTE — Progress Notes (Signed)
Procedure: Verbal consent obtained after discussion of risk and benefits.  Patient anesthetized via digital nerve block using 5 cc total of 2% lidocaine. Foil graft fitted and sewn into place using 6-0 Prolene ligature.  Mupirocin dressing placed.  Wound care instructions provided. Philis Fendt, MS, PA-C 5:08 PM, 10/04/2016

## 2016-10-04 NOTE — Progress Notes (Signed)
Patient ID: Elizabeth Lloyd, female   DOB: 04/18/1941, 75 y.o.   MRN: [id number] [facility] [address] [place] [address] [phone]  By signing my name below, I, Essence Howell, attest that this documentation has been prepared under the direction and in the presence of Ivar Drape, PA-C Electronically Signed: Ladene Artist, ED Scribe 10/04/2016 at 3:46 PM.  Date:  10/04/2016   Name:  Elizabeth Lloyd   DOB:  01/09/1941   MRN:  [id number]  PCP:  Marjorie Smolder, MD   History of Present Illness:  Elizabeth Lloyd is a 75 y.o. female patient who presents to [facility] complaining of a left index finger injury sustained PTA. Pt states that she was using a chickadee cutter at work when she unintentionally cut the tip of her left index finger. Bleeding is controlled with a gauze and pressure. Pt states that her last tetanus was a little over 10 years ago but it gave her a rash.   Medication list has been reviewed and updated.   Review of Systems  Skin:       + wound to L index finger    Physical Examination: BP 130/70 (BP Location: Right Arm, Patient Position: Sitting, Cuff Size: Normal)    Pulse 61    Temp 97.9 F (36.6 C) (Oral)    Resp 16    Ht 5\' 2"  (1.575 m)    Wt 219 lb 12.8 oz (99.7 kg)    SpO2 97%    BMI 40.20 kg/m  Ideal Body Weight: @FLOWAMB [id number]  Physical Exam  Constitutional: She is oriented to person, place, and time. She appears well-developed and well-nourished. No distress.  HENT:  Head: Normocephalic and atraumatic.  Right Ear: External ear normal.  Left Ear: External ear normal.  Eyes: Conjunctivae and EOM are normal. Pupils are equal, round, and reactive to light.  Cardiovascular: Normal rate.   Pulmonary/Chest: Effort normal. No respiratory distress.  Neurological: She is alert and oriented to person, place, and time.  Skin: Laceration noted. She is not diaphoretic.  Very distal end of L index finger with complete  laceration of the dermal layer. No bone detected. Laceration is ~1 cm.   Psychiatric: She has a normal mood and affect. Her behavior is normal.    Assessment and Plan: [name] is a 75 y.o. female who is here today for cc of laceration lf left index finger.   --foiled and dressings applied (see  Note).  Advised to return in 5 days for follow up.  Remove foil in 10 days.    Laceration of left index finger, foreign body presence unspecified, nail damage status unspecified, initial encounter - Plan: Td vaccine greater than or equal to 7yo preservative free IM  Ivar Drape, PA-C [facility] 10/04/2016 3:46 PM

## 2016-10-08 ENCOUNTER — Ambulatory Visit (INDEPENDENT_AMBULATORY_CARE_PROVIDER_SITE_OTHER): Payer: Worker's Compensation | Admitting: Physician Assistant

## 2016-10-08 VITALS — BP 146/84 | HR 54 | Temp 98.2°F | Resp 16 | Ht 62.5 in | Wt 216.0 lb

## 2016-10-08 DIAGNOSIS — S61211D Laceration without foreign body of left index finger without damage to nail, subsequent encounter: Secondary | ICD-10-CM

## 2016-10-08 NOTE — Patient Instructions (Addendum)
Keep covered while at work ok to leave it open when it is not going to get dirty    IF you received an x-ray today, you will receive an invoice from [facility]. Please contact [facility] at [phone] with questions or concerns regarding your invoice.   IF you received labwork today, you will receive an invoice from [employer]. Please contact [facility] at [phone] with questions or concerns regarding your invoice.   Our billing staff will not be able to assist you with questions regarding bills from these companies.  You will be contacted with the lab results as soon as they are available. The fastest way to get your results is to activate your My Chart account. Instructions are located on the last page of this paperwork. If you have not heard from [place] regarding the results in 2 weeks, please contact this office.

## 2016-10-08 NOTE — Progress Notes (Signed)
MRN: [id number] DOB: 1941-09-20  Subjective:  Pt presents to clinic with an injury that occurred at work on 10/04/2016.  She has been dealing with the injury ok since then - she has been keeping it covered with neosporin.  The pain is reduced.  Review of Systems  Constitutional: Negative for chills and fever.    Patients medications, problem list and allergies reviewed. Patients social, past and surgical history is reviewed.  Objective:  BP (!) 146/84 (BP Location: Right Arm, Patient Position: Sitting, Cuff Size: Large)   Pulse (!) 54   Temp 98.2 F (36.8 C)   Resp 16   Ht 5' 2.5" (1.588 m)   Wt 216 lb (98 kg)   SpO2 96%   BMI 38.88 kg/m   Physical Exam  Constitutional: She is oriented to person, place, and time and well-developed, well-nourished, and in no distress.  HENT:  Head: Normocephalic and atraumatic.  Right Ear: Hearing and external ear normal.  Left Ear: Hearing and external ear normal.  Eyes: Conjunctivae are normal.  Neck: Normal range of motion.  Pulmonary/Chest: Effort normal.  Neurological: She is alert and oriented to person, place, and time. Gait normal.  Skin: Skin is warm and dry.  Foil graft in place without surrounding erythema or pain.  Psychiatric: Mood, memory, affect and judgment normal.  Vitals reviewed.   Assessment and Plan :  Laceration of left index finger, foreign body presence unspecified, nail damage status unspecified, subsequent encounter  Continue keeping covered - recheck in 10 days from placement for liekkly suture removal.  Windell Hummingbird PA-C  [facility] 10/08/2016 9:44 AM

## 2016-10-14 ENCOUNTER — Ambulatory Visit (INDEPENDENT_AMBULATORY_CARE_PROVIDER_SITE_OTHER): Payer: Worker's Compensation | Admitting: Physician Assistant

## 2016-10-14 VITALS — BP 112/80 | HR 60 | Temp 98.6°F | Resp 20 | Ht 62.5 in | Wt 218.0 lb

## 2016-10-14 DIAGNOSIS — S61211D Laceration without foreign body of left index finger without damage to nail, subsequent encounter: Secondary | ICD-10-CM

## 2016-10-14 NOTE — Progress Notes (Signed)
   Patient: Elizabeth Lloyd [id number]  Subjective: Elizabeth Lloyd is returning for suture removal. Patient was initially seen 10/04/16 and had a foil graft placed. Denies fever, drainage of pus or blood, wound dehiscence, edema, pain.   Objective: Physical Exam  Constitutional: She is oriented to person, place, and time and well-developed, well-nourished, and in no distress.  HENT:  Head: Normocephalic and atraumatic.  Eyes: Conjunctivae are normal.  Neck: Normal range of motion.  Pulmonary/Chest: Effort normal.  Neurological: She is alert and oriented to person, place, and time. Gait normal.  Skin: Skin is warm and dry.  Foil graft and sutures in place without surrounding erythema or pain.   Psychiatric: Affect normal.  Vitals reviewed.  Sutures and foil graft removed without incident. Macerated skin visualized once foil graft removed. No erythema, induration, or pus drainage noted. Patient tolerated this well.   Assessment and Plan: 1. Laceration of left index finger, foreign body presence unspecified, nail damage status unspecified, subsequent encounter Well-healed wound, bandaid applied. Anticipatory guidance provided. Return to clinic as needed.  Tenna Delaine, PA-C  [facility] 10/14/2016 7:53 PM

## 2016-10-14 NOTE — Patient Instructions (Addendum)
Keep a bandaid on it. Avoid using any kind of vaseline products as this can slow down the healing.  Wash gently with soap and water and keep covered if you are going to work.  . Notify the office if you experience any of the following signs of infection: Swelling, redness, pus drainage, streaking, fever >101.0 F . Notify the office if you experience excessive bleeding that does not stop after 15-20 minutes of constant, firm pressure.       IF you received an x-ray today, you will receive an invoice from [facility]. Please contact [facility] at [phone] with questions or concerns regarding your invoice.   IF you received labwork today, you will receive an invoice from [employer]. Please contact [facility] at [phone] with questions or concerns regarding your invoice.   Our billing staff will not be able to assist you with questions regarding bills from these companies.  You will be contacted with the lab results as soon as they are available. The fastest way to get your results is to activate your My Chart account. Instructions are located on the last page of this paperwork. If you have not heard from [place] regarding the results in 2 weeks, please contact this office.

## 2016-11-29 ENCOUNTER — Other Ambulatory Visit: Payer: Medicare Other

## 2016-11-29 ENCOUNTER — Other Ambulatory Visit: Payer: Self-pay | Admitting: Family Medicine

## 2016-11-29 ENCOUNTER — Ambulatory Visit
Admission: RE | Admit: 2016-11-29 | Discharge: 2016-11-29 | Disposition: A | Discharge status: Home / Self Care | Payer: Medicare Other | Source: Ambulatory Visit | Attending: Family Medicine | Admitting: Family Medicine

## 2016-11-29 DIAGNOSIS — M545 Low back pain, unspecified: Secondary | ICD-10-CM

## 2017-02-03 ENCOUNTER — Other Ambulatory Visit: Payer: Self-pay | Admitting: Oncology

## 2017-02-06 ENCOUNTER — Other Ambulatory Visit: Payer: Self-pay | Admitting: *Deleted

## 2017-02-24 ENCOUNTER — Encounter: Payer: Self-pay | Admitting: Oncology

## 2017-06-28 ENCOUNTER — Other Ambulatory Visit: Payer: Self-pay | Admitting: Nurse Practitioner

## 2017-06-28 ENCOUNTER — Other Ambulatory Visit: Payer: Self-pay

## 2017-06-28 DIAGNOSIS — Z853 Personal history of malignant neoplasm of breast: Secondary | ICD-10-CM

## 2017-06-28 DIAGNOSIS — N63 Unspecified lump in unspecified breast: Secondary | ICD-10-CM

## 2017-07-02 ENCOUNTER — Telehealth: Payer: Self-pay

## 2017-07-02 NOTE — Telephone Encounter (Signed)
spoke with patient and she is aware of her new appts

## 2017-07-11 ENCOUNTER — Other Ambulatory Visit: Payer: Self-pay | Admitting: Nurse Practitioner

## 2017-07-11 DIAGNOSIS — M810 Age-related osteoporosis without current pathological fracture: Secondary | ICD-10-CM

## 2017-07-28 ENCOUNTER — Ambulatory Visit
Admission: RE | Admit: 2017-07-28 | Discharge: 2017-07-28 | Disposition: A | Discharge status: Home / Self Care | Payer: Medicare Other | Source: Ambulatory Visit

## 2017-07-28 ENCOUNTER — Ambulatory Visit
Admission: RE | Admit: 2017-07-28 | Discharge: 2017-07-28 | Disposition: A | Discharge status: Home / Self Care | Payer: Medicare Other | Source: Ambulatory Visit | Attending: Nurse Practitioner | Admitting: Nurse Practitioner

## 2017-07-28 DIAGNOSIS — M810 Age-related osteoporosis without current pathological fracture: Secondary | ICD-10-CM

## 2017-07-28 DIAGNOSIS — Z853 Personal history of malignant neoplasm of breast: Secondary | ICD-10-CM

## 2017-08-19 ENCOUNTER — Other Ambulatory Visit: Payer: Self-pay | Admitting: Oncology

## 2017-08-28 ENCOUNTER — Other Ambulatory Visit: Payer: Self-pay

## 2017-08-28 DIAGNOSIS — C50412 Malignant neoplasm of upper-outer quadrant of left female breast: Secondary | ICD-10-CM

## 2017-08-29 ENCOUNTER — Ambulatory Visit (HOSPITAL_BASED_OUTPATIENT_CLINIC_OR_DEPARTMENT_OTHER): Payer: Medicare Other | Admitting: Oncology

## 2017-08-29 ENCOUNTER — Other Ambulatory Visit (HOSPITAL_BASED_OUTPATIENT_CLINIC_OR_DEPARTMENT_OTHER): Payer: Medicare Other

## 2017-08-29 VITALS — BP 127/56 | HR 58 | Temp 98.0°F | Resp 20 | Ht 62.5 in | Wt 222.4 lb

## 2017-08-29 DIAGNOSIS — C50412 Malignant neoplasm of upper-outer quadrant of left female breast: Secondary | ICD-10-CM

## 2017-08-29 DIAGNOSIS — Z79811 Long term (current) use of aromatase inhibitors: Secondary | ICD-10-CM | POA: Diagnosis not present

## 2017-08-29 DIAGNOSIS — Z170 Estrogen receptor positive status [ER+]: Secondary | ICD-10-CM | POA: Diagnosis not present

## 2017-08-29 DIAGNOSIS — M810 Age-related osteoporosis without current pathological fracture: Secondary | ICD-10-CM

## 2017-08-29 LAB — COMPREHENSIVE METABOLIC PANEL
ALT: 31 U/L (ref 0–55)
ANION GAP: 8 meq/L (ref 3–11)
AST: 27 U/L (ref 5–34)
Albumin: 3.9 g/dL (ref 3.5–5.0)
Alkaline Phosphatase: 88 U/L (ref 40–150)
BUN: 13 mg/dL (ref 7.0–26.0)
CALCIUM: 10.2 mg/dL (ref 8.4–10.4)
CHLORIDE: 99 meq/L (ref 98–109)
CO2: 28 mEq/L (ref 22–29)
CREATININE: 0.8 mg/dL (ref 0.6–1.1)
EGFR: 74 mL/min/{1.73_m2} — ABNORMAL LOW (ref >90)
Glucose: 175 mg/dl — ABNORMAL HIGH (ref 70–140)
POTASSIUM: 3.8 meq/L (ref 3.5–5.1)
Sodium: 135 mEq/L — ABNORMAL LOW (ref 136–145)
Total Bilirubin: 0.79 mg/dL (ref 0.20–1.20)
Total Protein: 7.3 g/dL (ref 6.4–8.3)

## 2017-08-29 LAB — CBC WITH DIFFERENTIAL/PLATELET
BASO%: 0.2 % (ref 0.0–2.0)
BASOS ABS: 0 10*3/uL (ref 0.0–0.1)
EOS ABS: 0.1 10*3/uL (ref 0.0–0.5)
EOS%: 1 % (ref 0.0–7.0)
HEMATOCRIT: 41.5 % (ref 34.8–46.6)
HGB: 13.8 g/dL (ref 11.6–15.9)
LYMPH#: 2.1 10*3/uL (ref 0.9–3.3)
LYMPH%: 20.9 % (ref 14.0–49.7)
MCH: 29.1 pg (ref 25.1–34.0)
MCHC: 33.3 g/dL (ref 31.5–36.0)
MCV: 87.4 fL (ref 79.5–101.0)
MONO#: 0.8 10*3/uL (ref 0.1–0.9)
MONO%: 7.9 % (ref 0.0–14.0)
NEUT#: 7.1 10*3/uL — ABNORMAL HIGH (ref 1.5–6.5)
NEUT%: 70 % (ref 38.4–76.8)
PLATELETS: 233 10*3/uL (ref 145–400)
RBC: 4.75 10*6/uL (ref 3.70–5.45)
RDW: 13 % (ref 11.2–14.5)
WBC: 10.2 10*3/uL (ref 3.9–10.3)

## 2017-08-29 NOTE — Progress Notes (Signed)
ID: Calvert Cantor   DOB: [birth year]  MR#: [id number]  OVF#:[id number]  PCP: Kathyrn Lass, MD GYN: SU:  OTHER MD: Jamie Kato  CHIEF COMPLAINT: Estrogen receptor positive breast cancer  CURRENT TREATMENT: Anastrozole  BREAST CANCER HISTORY: From the original intent nodes:  The patient had routine screening mammography 06/24/2011 suggesting a possible abnormality in her left breast. Additional views 07/01/2011 showed no evidence of malignancy. There was no palpable abnormality. There was mild nodularity in the lateral quadrants of the left breast, and six-month followup was suggested. This was performed 01/20/2012, and showed stability. On 06/27/2012 the patient had bilateral diagnostic mammography, and this time the 9 mm area of focal asymmetric density in the upper outer left breast appeared more irregular than prior. Ultrasound showed no associated abnormality, and the patient proceeded to stereotactic biopsy 06/29/2012. 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   The patient's subsequent history is as detailed below.  INTERVAL HISTORY: Elizabeth Lloyd returns today for follow up of her estrogen receptor positive breast cancer. She continues on anastrozole, with good tolerance. She does not have issues with hot flashes or vaginal dryness. Pt was informed by her NP, Aimee Love following her bone density scan that she no longer needs to take Fosamax, although the bone density scan still shows osteoporosis    REVIEW OF SYSTEMS: Elizabeth Lloyd denies HA, visional change, nausea, vomiting, cough, and unusual pain. She reports back pain and right wrist tendonitis that is currently being treated with gabapentin. Pt does not exercise regularly. 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 A detailed review of systems today was otherwise stable.    PAST  MEDICAL HISTORY: Past Medical History:  Diagnosis Date  . Arthritis   . Breast cancer ([place])   . Diabetes mellitus    diet controlled  . Full dentures   . GERD (gastroesophageal reflux disease)   . H/O bone density study 2005  . H/O colonoscopy 06/18/10  . Hernia   . History of bronchitis   . History of hiatal hernia   . Hyperlipidemia   . Hypertension   . Urinary incontinence   . Wears glasses     PAST SURGICAL HISTORY: Past Surgical History:  Procedure Laterality Date  . ABDOMINAL HYSTERECTOMY    . BREAST LUMPECTOMY Left 2013  . CHOLECYSTECTOMY    . COLONOSCOPY    . ESOPHAGOGASTRODUODENOSCOPY    . HAND SURGERY     bilateral  . knee surgery     left  . lt breas lumpectomy  07/24/12  . REVERSE SHOULDER ARTHROPLASTY Left 01/22/2016   Procedure: LEFT REVERSE TOTAL SHOULDER ARTHROPLASTY;  Surgeon: Netta Cedars, MD;  Location: [facility];  Service: Orthopedics;  Laterality: Left;  . SHOULDER SURGERY     Bilateral    FAMILY HISTORY Family History  Problem Relation Age of Onset  . Breast cancer Mother   . Colon cancer Brother    the patient's father died from heart disease at the age of 68. The patient's mother lived to be 7. The patient had 7 brothers and 2 sister. One brother had colon cancer, but she does not know at what age it was diagnosed. The patient's mother was diagnosed with colon cancer at the age of 17 and breast cancer at the same time. There is no other history of breast or ovarian cancer in the family.  GYNECOLOGIC  HISTORY: Menarche age 70, "can't remember" when she went through the change of life. She did not take hormone replacement. She is GX P2, first live birth age 41.  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 She is widowed, and lives with her daughter Elizabeth Lloyd in [place].  Her daughter Elizabeth Lloyd is also living in [place]. The patient has 2 grandchildren age 59 and 61 as of July 2013. She is not a Ambulance person.   ADVANCED  DIRECTIVES:  in place, separately scanned. She has named her daughter, Elizabeth Lloyd as her healthcare power of attorney. Elizabeth Lloyd can be reached at [phone]. The patient also completed and notarized a living will.  HEALTH MAINTENANCE: Social History  Substance Use Topics  . Smoking status: Never Smoker  . Smokeless tobacco: Never Used  . Alcohol use No     Colonoscopy: 2011/ [facility]  PAP:  Bone density: 2015  Lipid panel:   Allergies  Allergen Reactions  . Pertussis Vaccines Rash    Current Outpatient Prescriptions  Medication Sig Dispense Refill  . albuterol (PROVENTIL HFA;VENTOLIN HFA) 108 (90 BASE) MCG/ACT inhaler Inhale 2 puffs into the lungs every 6 (six) hours as needed.    Marland Kitchen alendronate (FOSAMAX) 70 MG tablet TAKE 1 TABLET BY MOUTH ONCE A WEEK IN THE MORNING WITH A FULL GLASS OF WATER, 30 MINUTES BEFORE A MEAL OR BEVERAGE. REMAIN UPRIGHT. 12 tablet 1  . anastrozole (ARIMIDEX) 1 MG tablet TAKE ONE TABLET BY MOUTH ONCE DAILY 90 tablet 2  . aspirin 81 MG tablet Take 81 mg by mouth daily.    Marland Kitchen atorvastatin (LIPITOR) 80 MG tablet Take 80 mg by mouth daily.     . calcium carbonate (OS-CAL) 600 MG TABS Take 600 mg by mouth 2 (two) times daily with a meal.    . Cranberry 500 MG CAPS Take 500 mg by mouth every other day.     . docusate sodium (COLACE) 100 MG capsule Take 100 mg by mouth 4 (four) times daily.    . ergocalciferol (VITAMIN D2) 50000 UNITS capsule Take 50,000 Units by mouth once a week.    . fish oil-omega-3 fatty acids 1000 MG capsule Take 1,000 g by mouth daily.    . Glucosamine-Chondroit-Vit C-Mn (GLUCOSAMINE CHONDR 1500 COMPLX PO) Take 3,000 mg by mouth daily.    Marland Kitchen losartan-hydrochlorothiazide (HYZAAR) 50-12.5 MG per tablet Take 1 tablet by mouth daily.    Marland Kitchen omeprazole (PRILOSEC) 40 MG capsule Take 40 mg by mouth daily.    . Plant Sterols and Stanols (CHOLEST OFF PO) Take 1 tablet by mouth daily.    . polyethylene glycol (MIRALAX / GLYCOLAX) packet Take 17 g by mouth  daily.    . Probiotic Product (ALIGN PO) Take 1 tablet by mouth daily.    . Psyllium 48.57 % POWD Take 10 mLs by mouth daily.     No current facility-administered medications for this visit.    Objective: Older white woman In no acute distress  Vitals:   08/29/17 1101  BP: (!) 127/56  Pulse: (!) 58  Resp: 20  Temp: 98 F (36.7 C)  SpO2: 97%      Body mass index is 40.03 kg/m.    ECOG FS: 1 Filed Weights   08/29/17 1101  Weight: 222 lb 6.4 oz (100.9 kg)   Sclerae unicteric, EOMs intact Oropharynx clear and moist No cervical or supraclavicular adenopathy Lungs no rales or rhonchi Heart regular rate and rhythm Abd soft, nontender, positive bowel sounds  MSK no focal spinal tenderness, minimal lower spinal tenderness to moderate palpation Neuro: nonfocal, well oriented, appropriate affect Breasts: The right breast is benign. The left breast is undergone lumpectomy. There is no evidence of local recurrence. Both axillae are benign.   LAB RESULTS: Lab Results  Component Value Date   WBC 10.2 08/29/2017   NEUTROABS 7.1 (H) 08/29/2017   HGB 13.8 08/29/2017   HCT 41.5 08/29/2017   MCV 87.4 08/29/2017   PLT 233 08/29/2017      Chemistry      Component Value Date/Time   NA 135 (L) 08/29/2017 1026   K 3.8 08/29/2017 1026   CL 98 (L) 01/23/2016 0525   CL 101 03/15/2013 1445   CO2 28 08/29/2017 1026   BUN 13.0 08/29/2017 1026   CREATININE 0.8 08/29/2017 1026      Component Value Date/Time   CALCIUM 10.2 08/29/2017 1026   ALKPHOS 88 08/29/2017 1026   AST 27 08/29/2017 1026   ALT 31 08/29/2017 1026   BILITOT 0.79 08/29/2017 1026       Lab Results  Component Value Date   LABCA2 31 01/24/2013    STUDIES:  Bilateral diagnostic mammography with [facility] at [facility] 07/28/2017 showed the breast density to be category B. There was no evidence of malignancy.  Bone density on 07/28/2017 shows osteoporosis with a T score of -2.5   ASSESSMENT: 76 y.o.  [place] woman,   (1)  status post left breast upper outer quadrant lumpectomy and sentinel lymph node biopsy 07/24/2012 for a pT1c pN0, stage IA invasive ductal carcinoma, grade 1, strongly estrogen and progesterone receptor positive, with an MIB-1 of 13, and no HER-2 amplification.  (2)  started anastrozole in September 2013  (3)  Osteoporosis; received one dose of zoledronic acid in late March 2014, with very poor tolerance, (severe bone pain). This was subsequently discontinued.  (a) alendronate started April 2016, discontinued August 2018   PLAN:  Braylyn is now a little over 5 years out from definitive surgery for her breast cancer with no evidence of disease recurrence. This is very favorable.  She tolerated Arimidex well and she is now ready to stop. While continuing an additional 2 years in some cases can mildly reduce the risk of recurrence, given her initial excellent prognosis this would be of very little if any benefit to her.  Accordingly she is going off anastrozole now I am comfortable releasing her to her primary care physician's care  Incidentally her bone density still shows osteoporosis. She tells me she has been instructed to stop the Fosamax. I do not see that in the most recent note from Ms. love but the patient will be reevaluated there within the next 2 months and she can discuss that issue further with her primary care physician  All Elizabeth Lloyd will need in terms of breast cancer follow-up is a yearly physician breast exam and yearly mammography  I will be glad to see her at any point in the future if on when the need arises but as of now we're making no further routine appointment for her here.  Magrinat, Valentino Hue, MD  08/29/17 11:26 AM [facility] [address] [place], [place] [address] Tel. [phone]    Fax. [phone]  This document serves as a record of services personally performed by Ruthann Cancer,  MD. It was created on her behalf by Chestine Spore, a trained medical scribe. The creation of this record is based  on the scribe's personal observations and the provider's statements to them. This document has been checked and approved by the attending provider.

## 2017-08-30 ENCOUNTER — Telehealth: Payer: Self-pay | Admitting: Oncology

## 2017-08-30 NOTE — Telephone Encounter (Signed)
Per 9/11 no los °

## 2017-09-14 ENCOUNTER — Ambulatory Visit: Payer: Medicare Other | Admitting: Oncology

## 2017-09-14 ENCOUNTER — Other Ambulatory Visit: Payer: Medicare Other

## 2018-06-19 ENCOUNTER — Other Ambulatory Visit: Payer: Self-pay | Admitting: Family Medicine

## 2018-06-19 DIAGNOSIS — Z1231 Encounter for screening mammogram for malignant neoplasm of breast: Secondary | ICD-10-CM

## 2018-07-30 ENCOUNTER — Ambulatory Visit
Admission: RE | Admit: 2018-07-30 | Discharge: 2018-07-30 | Disposition: A | Discharge status: Home / Self Care | Payer: Medicare Other | Source: Ambulatory Visit | Attending: Family Medicine | Admitting: Family Medicine

## 2018-07-30 DIAGNOSIS — Z1231 Encounter for screening mammogram for malignant neoplasm of breast: Secondary | ICD-10-CM

## 2019-06-18 ENCOUNTER — Other Ambulatory Visit: Payer: Self-pay | Admitting: Family Medicine

## 2019-06-18 DIAGNOSIS — Z1231 Encounter for screening mammogram for malignant neoplasm of breast: Secondary | ICD-10-CM

## 2019-08-02 ENCOUNTER — Other Ambulatory Visit: Payer: Self-pay

## 2019-08-02 ENCOUNTER — Ambulatory Visit
Admission: RE | Admit: 2019-08-02 | Discharge: 2019-08-02 | Disposition: A | Discharge status: Home / Self Care | Payer: Medicare Other | Source: Ambulatory Visit | Attending: Family Medicine | Admitting: Family Medicine

## 2019-08-02 DIAGNOSIS — Z1231 Encounter for screening mammogram for malignant neoplasm of breast: Secondary | ICD-10-CM
